# Patient Record
Sex: Male | Born: 1970 | Race: White | Hispanic: No | Marital: Married | Smoking: Former smoker
Health system: Southern US, Community
[De-identification: ages and names within clinical notes are randomized; demographics above are authoritative.]

## PROBLEM LIST (undated history)

## (undated) DIAGNOSIS — U071 COVID-19: Secondary | ICD-10-CM

## (undated) DIAGNOSIS — G4733 Obstructive sleep apnea (adult) (pediatric): Secondary | ICD-10-CM

## (undated) HISTORY — PX: OTHER SURGICAL HISTORY: SHX169

## (undated) HISTORY — DX: Obstructive sleep apnea (adult) (pediatric): G47.33

## (undated) HISTORY — PX: TONSILLECTOMY: SUR1361

---

## 1998-06-15 ENCOUNTER — Ambulatory Visit: Admission: RE | Admit: 1998-06-15 | Discharge: 1998-06-15 | Payer: Self-pay | Admitting: Internal Medicine

## 2005-01-17 ENCOUNTER — Ambulatory Visit: Payer: Self-pay | Admitting: Internal Medicine

## 2007-02-25 ENCOUNTER — Ambulatory Visit: Payer: Self-pay | Admitting: Internal Medicine

## 2007-04-16 ENCOUNTER — Ambulatory Visit (HOSPITAL_BASED_OUTPATIENT_CLINIC_OR_DEPARTMENT_OTHER): Admission: RE | Admit: 2007-04-16 | Discharge: 2007-04-16 | Payer: Self-pay | Admitting: Internal Medicine

## 2007-04-18 ENCOUNTER — Ambulatory Visit: Payer: Self-pay | Admitting: Internal Medicine

## 2008-02-28 DIAGNOSIS — G4733 Obstructive sleep apnea (adult) (pediatric): Secondary | ICD-10-CM | POA: Insufficient documentation

## 2008-02-29 ENCOUNTER — Ambulatory Visit: Payer: Self-pay | Admitting: Internal Medicine

## 2008-05-23 ENCOUNTER — Ambulatory Visit (HOSPITAL_COMMUNITY): Admission: RE | Admit: 2008-05-23 | Discharge: 2008-05-23 | Payer: Self-pay | Admitting: Family Medicine

## 2009-02-13 ENCOUNTER — Encounter: Payer: Self-pay | Admitting: Internal Medicine

## 2009-02-27 ENCOUNTER — Ambulatory Visit: Payer: Self-pay | Admitting: Internal Medicine

## 2010-03-05 ENCOUNTER — Ambulatory Visit: Payer: Self-pay | Admitting: Internal Medicine

## 2010-09-01 HISTORY — PX: PECTORALIS MAJOR REPAIR: SHX2182
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
## 2011-01-14 NOTE — Assessment & Plan Note (Signed)
[facility]                             PULMONARY OFFICE NOTE   NAME:Matthew Stanton, Matthew Stanton                   MRN:          [id number]  DATE:02/25/2007                            DOB:          1971/03/26    PROBLEM:  Obstructive sleep apnea.   HISTORY:  He continues with CPAP at 8 CWP through [place].  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  We discussed lifestyle and  sleep hygiene issues.  He had had his original sleep study on June 15, 1998 when he weighed 250 pounds.  That split protocol study showed  severe obstructive apnea with an index of 88 per hour, loud snoring and  desaturation, but normal cardiac rhythm.  CPAP was titrated down  originally to 10 CWP for an index of 5 per hour.  Subsequently he lost a  lot of weight, which he has been able to keep off and CPAP was adjusted  down to 8 CWP.  He is concerned about adequate control and he wants to  have a new titration study done.  We discussed this.   OBJECTIVE:  Weight 171 pounds, BP 108/72, pulse 88, room air saturation  97%.  He is alert, medium body build, recognizing he weighs 80 pounds less  then he did when he had his original sleep study.  HEART:  Sounds are normal.  LUNGS:  Clear.  There is no restlessness or tremor.  His nasal airway seems adequately open.   IMPRESSION:  Obstructive sleep apnea with control status uncertain now  after a significant weight loss and no longer working a rotating shift.   PLAN:  1. Replacement CPAP mask.  2. CPAP titration study for recalibration of best pressure settings.  3. We will schedule return in a year and contact him for pressure      settings as appropriate.     Clinton D. Maple Hudson, MD, Tonny Bollman, FACP  Electronically Signed    CDY/MedQ  DD: 02/25/2007  DT: 02/26/2007  Job #:  [id number]   cc:   [facility]

## 2011-01-14 NOTE — Procedures (Signed)
NAME:  Matthew Stanton, Matthew Stanton NO.:  [id number]   MEDICAL RECORD NO.:  [id number]          PATIENT TYPE:  OUT   LOCATION:  SLEEP CENTER                 FACILITY:  [facility]   PHYSICIAN:  Clinton D. Maple Hudson, MD, FCCP, FACPDATE OF BIRTH:  03/06/1993   DATE OF STUDY:  04/16/2007                            NOCTURNAL POLYSOMNOGRAM   REFERRING PHYSICIAN:  Clinton D. Young, MD, FCCP, FACP   INDICATION FOR STUDY:  Hypersomnia with sleep apnea.  A previous sleep  study was done June 15, 1998 recording an AHI of 88 per hour and CPAP  was then titrated to 10 CWP.  CPAP titration is now requested.   EPWORTH SLEEPINESS SCORE:  5/24.  BMI 27.4, weight 175 pounds.   MEDICATIONS:  Home medications limited to vitamins.   SLEEP ARCHITECTURE:  Total sleep time 353 minutes and sleep efficiency  91%.  Stage I was 4%, stage II 58%, stage III 4%, REM 34% of total sleep  time.  REM rebound is suggested.  Sleep latency 24 minutes.  REM latency  56 minutes.  Awake after sleep onset 11 minutes.  Arousal index 4.8.  No  bedtime medication was taken.   RESPIRATORY DATA:  CPAP titration protocol.  CPAP was titrated to 10  CWP, AHI 0 per hour.  A large Mirage Activa mask was used with heated  humidifier.   OXYGEN DATA:  Mild snoring before CPAP control was prevented by  therapeutic CPAP levels, maintaining oxygen saturation 97% on room air.   CARDIAC DATA:  Normal sinus rhythm.   MOVEMENT-PARASOMNIA:  No significant movement disturbance.   IMPRESSIONS-RECOMMENDATIONS:  1. Successful CPAP titration to 10 CWP, apnea/hypopnea index 0 per      hour.  The patient used his own large Mirage Activa mask with      heated humidifier.  2. Original sleep study June 15, 1998 recorded an apnea/hypopnea      index of 88 per hour with split protocol      CPAP titration to 10 CWP.  On the present titration, obstructive      events were prevented at all pressures tried and snoring was      suppressed at final  pressure of 10 CWP.      Clinton D. Maple Hudson, MD, [facility], FACP  Diplomate, Biomedical engineer of Sleep Medicine  Electronically Signed     CDY/MEDQ  D:  04/18/2007 15:39:38  T:  04/19/2007 09:59:22  Job:  [id number]

## 2011-03-04 ENCOUNTER — Ambulatory Visit: Payer: Self-pay | Admitting: Internal Medicine

## 2011-04-11 ENCOUNTER — Ambulatory Visit: Payer: Self-pay | Admitting: Internal Medicine

## 2011-05-23 ENCOUNTER — Ambulatory Visit: Payer: Self-pay | Admitting: Internal Medicine

## 2011-12-02 ENCOUNTER — Emergency Department (HOSPITAL_COMMUNITY): Payer: Managed Care, Other (non HMO)

## 2011-12-02 ENCOUNTER — Emergency Department (HOSPITAL_COMMUNITY)
Admission: EM | Admit: 2011-12-02 | Discharge: 2011-12-02 | Disposition: A | Discharge status: Home / Self Care | Payer: Managed Care, Other (non HMO) | Attending: Emergency Medicine | Admitting: Emergency Medicine

## 2011-12-02 ENCOUNTER — Encounter (HOSPITAL_COMMUNITY): Payer: Self-pay | Admitting: Emergency Medicine

## 2011-12-02 DIAGNOSIS — M79609 Pain in unspecified limb: Secondary | ICD-10-CM | POA: Insufficient documentation

## 2011-12-02 DIAGNOSIS — R52 Pain, unspecified: Secondary | ICD-10-CM | POA: Insufficient documentation

## 2011-12-02 DIAGNOSIS — M25519 Pain in unspecified shoulder: Secondary | ICD-10-CM | POA: Insufficient documentation

## 2011-12-02 DIAGNOSIS — S46912A Strain of unspecified muscle, fascia and tendon at shoulder and upper arm level, left arm, initial encounter: Secondary | ICD-10-CM

## 2011-12-02 DIAGNOSIS — IMO0002 Reserved for concepts with insufficient information to code with codable children: Secondary | ICD-10-CM | POA: Insufficient documentation

## 2011-12-02 IMAGING — CR DG SHOULDER 2+V*L*
3 series · 3 of 3 positions shown · non-contrast
Comparison: None

CLINICAL DATA: Injured left shoulder.

LEFT SHOULDER - 2+ VIEW

[view not recorded (1 of 3)]
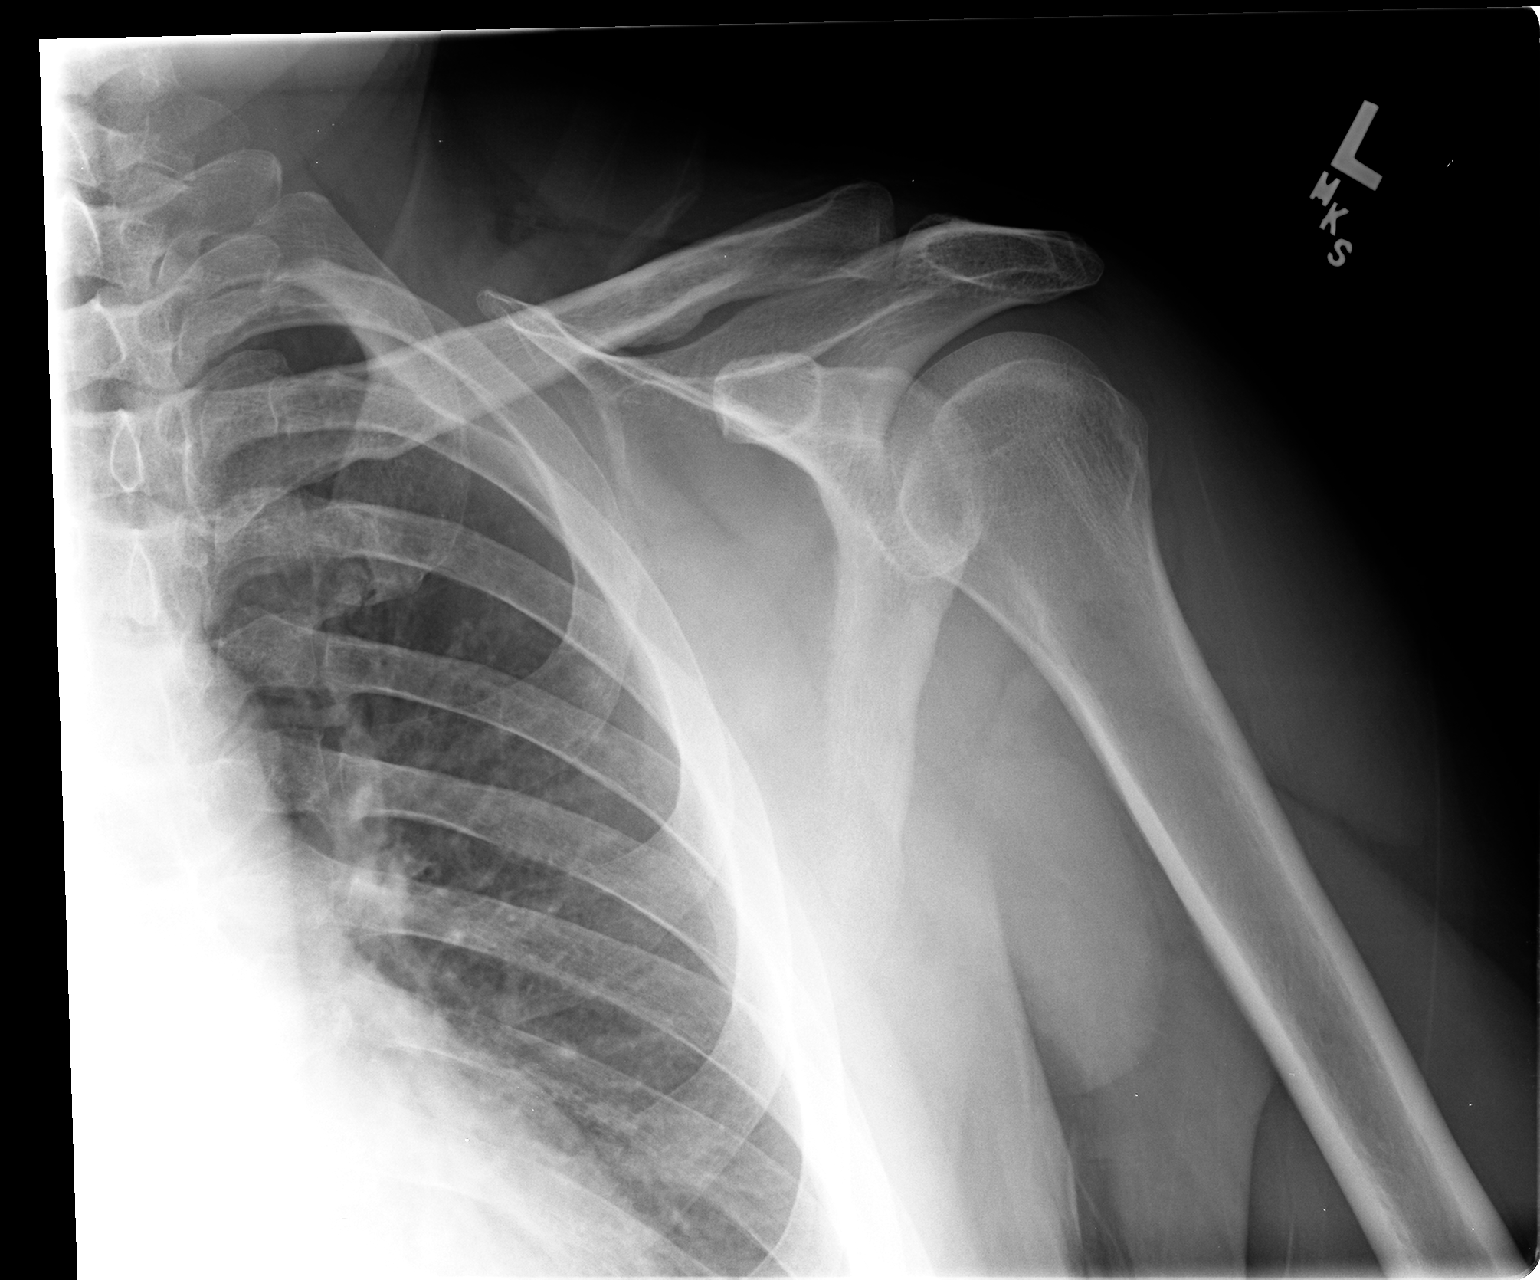

[view not recorded (2 of 3)]
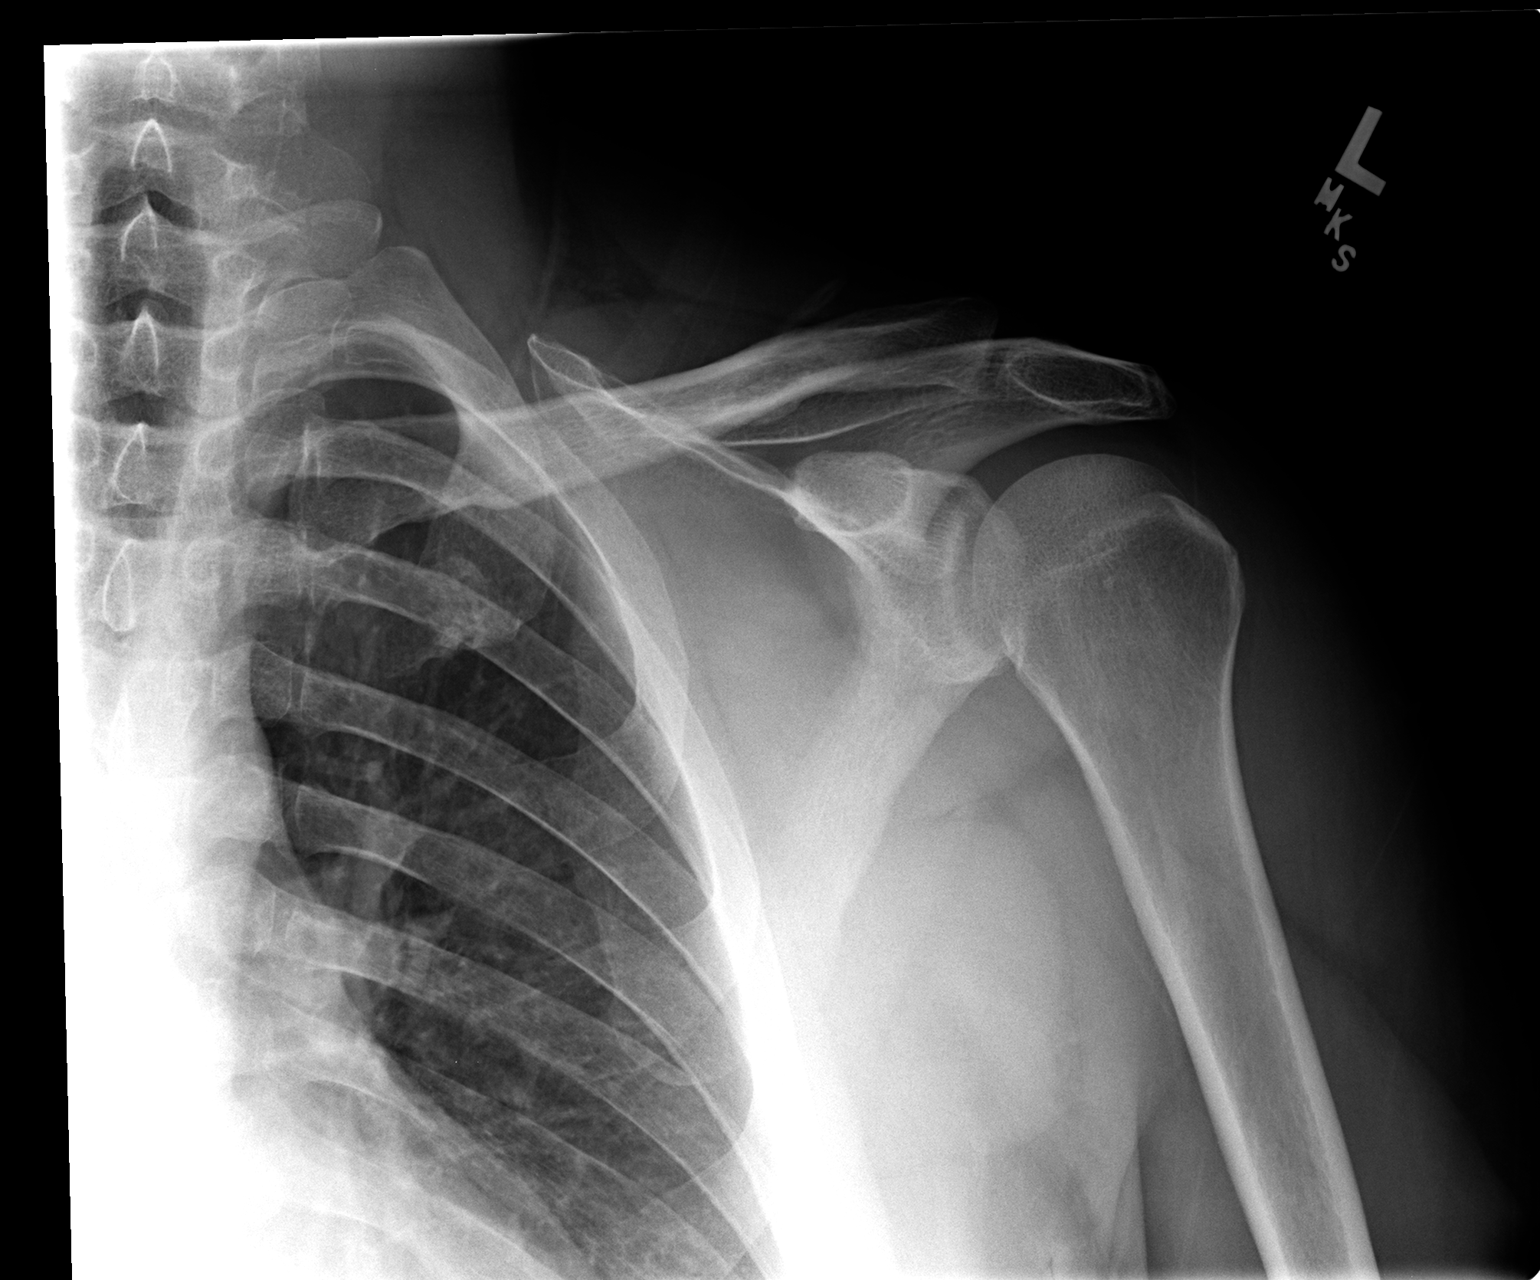

[view not recorded (3 of 3)]
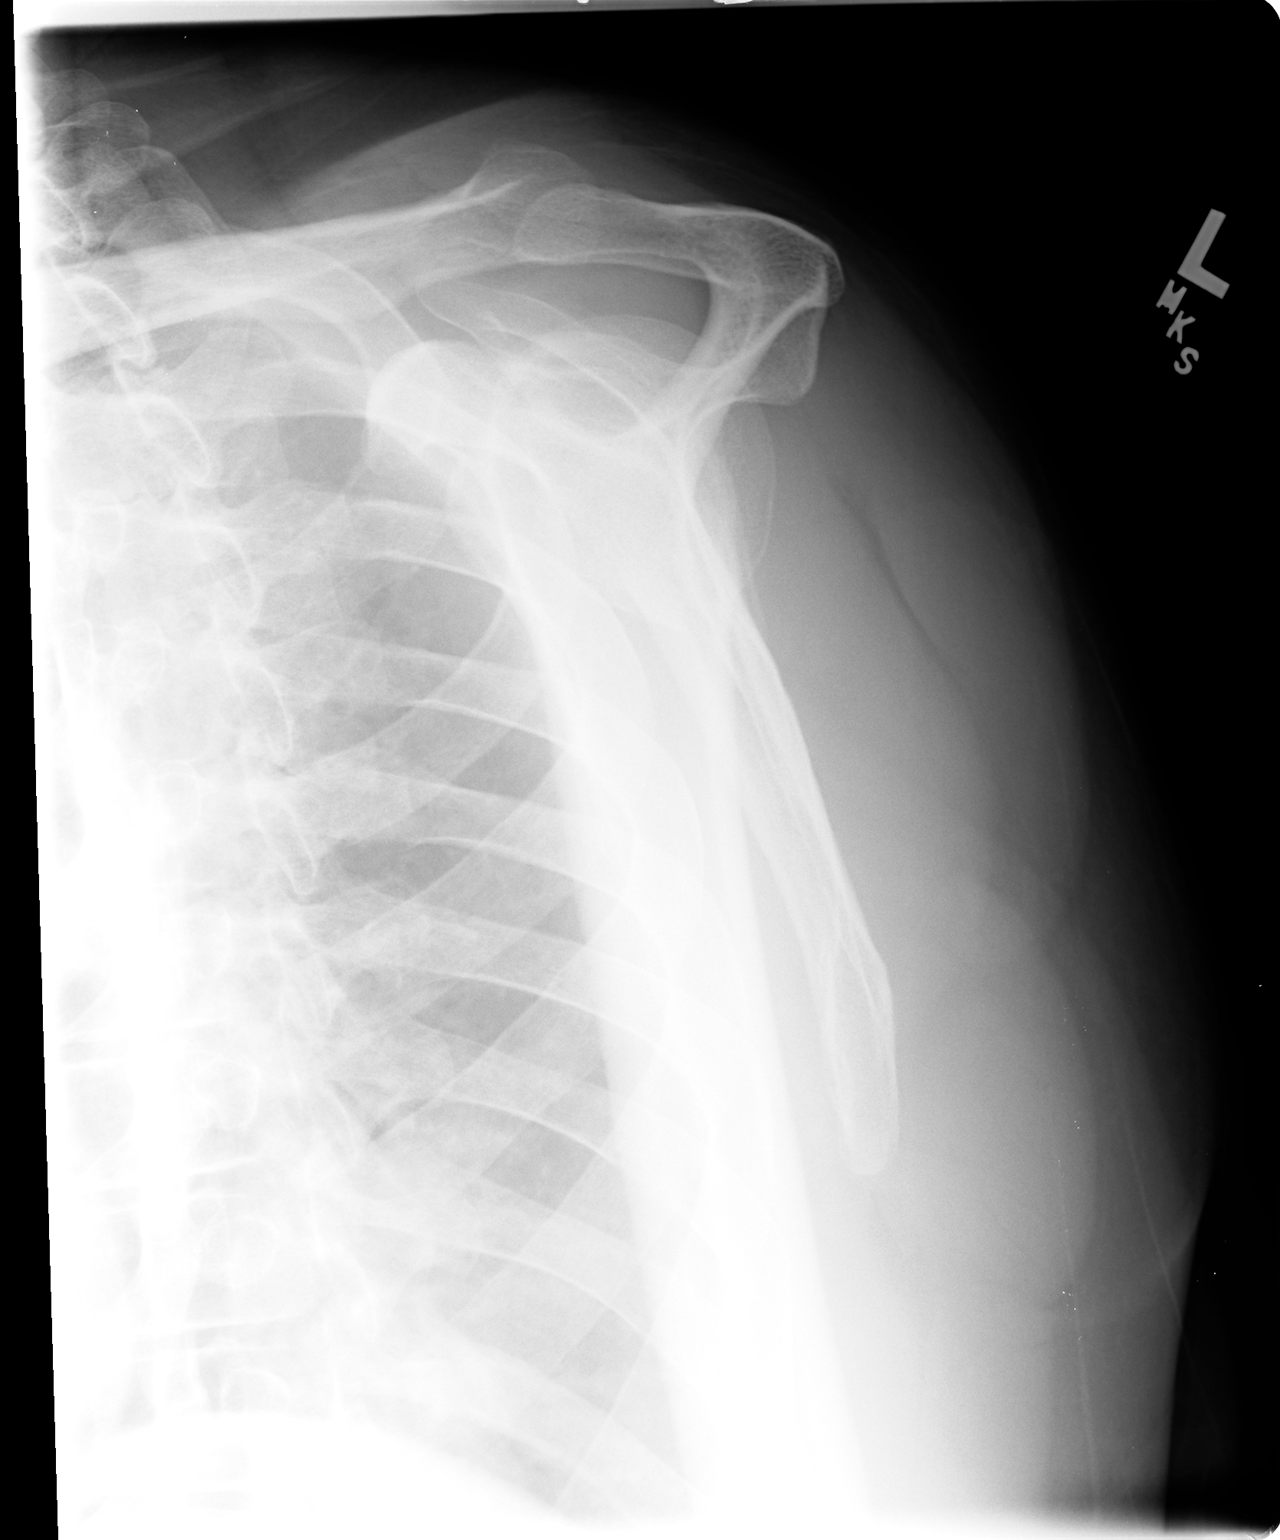

[3 of 3 positions shown; findings below may reference images not displayed]

FINDINGS: Joint spaces are maintained.  No acute bony findings.  No
abnormal soft tissue calcifications at the left lung apex is clear.
IMPRESSION: No acute bony findings.

## 2011-12-02 MED ORDER — IBUPROFEN 800 MG PO TABS
800.0000 mg | ORAL_TABLET | Freq: Once | ORAL | Status: AC
  Administered 2011-12-02: 800 mg via ORAL
  Filled 2011-12-02: qty 1

## 2011-12-02 MED ORDER — IBUPROFEN 800 MG PO TABS: 800.0000 mg | ORAL_TABLET | Freq: Three times a day (TID) | ORAL | Status: AC

## 2011-12-02 MED ORDER — HYDROCODONE-ACETAMINOPHEN 5-325 MG PO TABS: ORAL_TABLET | ORAL | Status: DC

## 2011-12-02 MED ORDER — HYDROCODONE-ACETAMINOPHEN 5-325 MG PO TABS
2.0000 | ORAL_TABLET | Freq: Once | ORAL | Status: AC
  Administered 2011-12-02: 2 via ORAL
  Filled 2011-12-02: qty 2

## 2011-12-02 NOTE — ED Notes (Signed)
Pt c/o burning/tearing pain under left axilla while lifting weights.

## 2011-12-02 NOTE — Discharge Instructions (Signed)
Your x-ray is negative for fracture or dislocation. Your examination is consistent with a muscle strain. Please see the orthopedist listed above, or the orthopedist of your choice for additional evaluation if not improving in the next 5-6 days. Please use a sling for the next 5-6 days. Ibuprofen 3 times daily with a meal for swelling and inflammationLigament Sprain Ligaments are tough, fibrous tissues that hold bones together at the joints. A sprain can occur when a ligament is stretched. This injury may take several weeks to heal. HOME CARE INSTRUCTIONS   Rest the injured area for as long as directed by your caregiver. Then slowly start using the joint as directed by your caregiver and as the pain allows.   Keep the affected joint raised if possible to lessen swelling.   Apply ice for 15 to 20 minutes to the injured area every couple hours for the first half day, then 3 to 4 times per day for the first 48 hours. Put the ice in a plastic bag and place a towel between the bag of ice and your skin.   Wear any splinting, casting, or elastic bandage applications as instructed.   Only take over-the-counter or prescription medicines for pain, discomfort, or fever as directed by your caregiver. Do not use aspirin immediately after the injury unless instructed by your caregiver. Aspirin can cause increased bleeding and bruising of the tissues.   If you were given crutches, continue to use them as instructed and do not resume weight bearing on the affected extremity until instructed.  SEEK MEDICAL CARE IF:   Your bruising, swelling, or pain increases.   You have cold and numb fingers or toes if your arm or leg was injured.  SEEK IMMEDIATE MEDICAL CARE IF:   Your toes are numb or blue if your leg was injured.   Your fingers are numb or blue if your arm was injured.   Your pain is not responding to medicines and continues to stay the same or gets worse.  MAKE SURE YOU:   Understand these  instructions.   Will watch your condition.   Will get help right away if you are not doing well or get worse.  Document Released: 08/15/2000 Document Revised: 08/07/2011 Document Reviewed: 06/13/2008 [facility] Patient Information 2012 [place], [place].Muscle Strain A muscle strain (pulled muscle) happens when a muscle is over-stretched. Recovery usually takes 5 to 6 weeks.  HOME CARE  Put ice on the injured area.  Put ice in a plastic bag.  Place a towel between your skin and the bag.  Leave the ice on for 15 to 20 minutes at a time, every hour for the first 2 days.  Do not use the muscle for several days or until your doctor says you can. Do not use the muscle if you have pain.  Wrap the injured area with an elastic bandage for comfort. Do not put it on too tightly.  Only take medicine as told by your doctor.  Warm up before exercise. This helps prevent muscle strains.  GET HELP RIGHT AWAY IF:  There is increased pain or puffiness (swelling) in the affected area. MAKE SURE YOU:  Understand these instructions.  Will watch your condition.  Will get help right away if you are not doing well or get worse.  Document Released: 05/27/2008 Document Revised: 08/07/2011 Document Reviewed: 05/27/2008 [facility] Patient Information 2012 [employer] and Norco 5 mg every 4 hours as needed for more severe pain.

## 2011-12-02 NOTE — ED Notes (Signed)
Pt states was lifting 225 lb weights when he felt a tear/ pop  In his left axilla shoulder area.
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
## 2011-12-27 NOTE — ED Provider Notes (Signed)
Evaluation and management procedures were performed by the PA/NP/resident physician under my supervision/collaboration.   Oberon Hehir D Terrion Poblano, MD 12/27/11 2045 

## 2012-01-01 ENCOUNTER — Encounter: Payer: Self-pay | Admitting: Internal Medicine

## 2012-12-21 ENCOUNTER — Encounter: Payer: Self-pay | Admitting: Internal Medicine

## 2012-12-21 ENCOUNTER — Ambulatory Visit (INDEPENDENT_AMBULATORY_CARE_PROVIDER_SITE_OTHER): Payer: Managed Care, Other (non HMO) | Admitting: Internal Medicine

## 2012-12-21 VITALS — BP 110/80 | HR 80 | Ht 66.75 in | Wt 188.2 lb

## 2012-12-21 DIAGNOSIS — G4733 Obstructive sleep apnea (adult) (pediatric): Secondary | ICD-10-CM

## 2012-12-21 NOTE — Progress Notes (Signed)
12/21/12- 42 yoM former smoker followed for OSA FOLLOWS FOR: last seen 03-05-2010; wears CPAP10 every night for about 7 hours-DME compnay is [facility] CPAP definitely prevents his snoring and he sleeps better. He is increasing his exercise to keep weight down.  ROS-see HPI Constitutional:   No-   weight loss, night sweats, fevers, chills, fatigue, lassitude. HEENT:   No-  headaches, difficulty swallowing, tooth/dental problems, sore throat,       No-  sneezing, itching, ear ache, nasal congestion, post nasal drip,  CV:  No-   chest pain, orthopnea, PND, swelling in lower extremities, anasarca,                                  dizziness, palpitations Resp: No-   shortness of breath with exertion or at rest.              No-   productive cough,  No non-productive cough,  No- coughing up of blood.              No-   change in color of mucus.  No- wheezing.   Skin: No-   rash or lesions. GI:  No-   heartburn, indigestion, abdominal pain, nausea, vomiting,  GU: . MS:  No-   joint pain or swelling.   Neuro-     nothing unusual Psych:  No- change in mood or affect. No depression or anxiety.  No memory loss.  OBJ- Physical Exam General- Alert, Oriented, Affect-appropriate, Distress- none acute Skin- rash-none, lesions- none, excoriation- none Lymphadenopathy- none Head- atraumatic            Eyes- Gross vision intact, PERRLA, conjunctivae and secretions clear            Ears- Hearing, canals-normal            Nose- Clear, no-Septal dev, mucus, polyps, erosion, perforation             Throat- Mallampati III , mucosa clear , drainage- none, tonsils- atrophic Neck- flexible , trachea midline, no stridor , thyroid nl, carotid no bruit Chest - symmetrical excursion , unlabored           Heart/CV- RRR , no murmur , no gallop  , no rub, nl s1 s2                           - JVD- none , edema- none, stasis changes- none, varices- none           Lung- clear to P&A, wheeze- none, cough- none , dullness-none,  rub- none           Chest wall-  Abd-  Br/ Gen/ Rectal- Not done, not indicated Extrem- cyanosis- none, clubbing, none, atrophy- none, strength- nl Neuro- grossly intact to observation

## 2012-12-21 NOTE — Patient Instructions (Addendum)
We can continue CPAP 10/ [facility]  Please call as needed

## 2012-12-28 NOTE — Assessment & Plan Note (Signed)
Good compliance and control with CPAP 10. No need to make changes. He has made a significant effort to maintain exercise and keep his weight down. Congratulated for this. Clearly helps.

## 2013-06-29 ENCOUNTER — Telehealth: Payer: Self-pay | Admitting: Internal Medicine

## 2013-06-29 DIAGNOSIS — G4733 Obstructive sleep apnea (adult) (pediatric): Secondary | ICD-10-CM

## 2013-06-29 NOTE — Telephone Encounter (Signed)
LMOM x 1 Need to find out what all supplies he is needing before order to be placed.

## 2013-06-30 NOTE — Telephone Encounter (Signed)
Order placed for supplies. Carron Curie, CMA

## 2013-07-15 ENCOUNTER — Telehealth: Payer: Self-pay | Admitting: Internal Medicine

## 2013-07-15 NOTE — Telephone Encounter (Signed)
Note    Order faxed to Retta Diones                                    Type Date User    Provider Comments 06/30/2013  8:40 AM Carron Curie R           Summary    Provider Comments           Note    Pt needs cpap supplies. [email or web address]       PCC, please advise.  Pt's aware that I am sending to [facility] to help find out what is going on. [facility] to update patient.

## 2013-07-19 NOTE — Telephone Encounter (Signed)
Spoke to [email or web address]  [email or web address] was called but he hung up on caller she will call him right now to see what supplies ne needs Tobe Sos

## 2013-07-19 NOTE — Telephone Encounter (Signed)
Please advise PCC. Carron Curie, CMA

## 2015-01-26 ENCOUNTER — Ambulatory Visit: Payer: Managed Care, Other (non HMO) | Admitting: Internal Medicine

## 2015-03-02 ENCOUNTER — Encounter: Payer: Self-pay | Admitting: Internal Medicine

## 2015-03-30 ENCOUNTER — Encounter: Payer: Self-pay | Admitting: Nurse Practitioner

## 2015-03-30 ENCOUNTER — Ambulatory Visit (INDEPENDENT_AMBULATORY_CARE_PROVIDER_SITE_OTHER): Payer: Managed Care, Other (non HMO) | Admitting: Nurse Practitioner

## 2015-03-30 VITALS — BP 117/74 | HR 80 | Temp 97.0°F | Ht 66.5 in | Wt 184.8 lb

## 2015-03-30 DIAGNOSIS — D649 Anemia, unspecified: Secondary | ICD-10-CM | POA: Diagnosis not present

## 2015-03-30 DIAGNOSIS — Z800 Family history of malignant neoplasm of digestive organs: Secondary | ICD-10-CM
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
## 2015-03-30 NOTE — Assessment & Plan Note (Addendum)
Patient with a family history of colorectal cancer including his father who was diagnosed at age 43 and his paternal grandmother who was diagnosed at an unknown age but passed away at age 49. Based on recommendations he is due for CRC screening starting at age 34. His anemia for which he was referred to [place] is likely due to blood donation. Given these concerns however I will send him home with a iFOBT test to evaluate for microscopic hematochezia. Return as needed unless his heme test is positive at which point we'll bring him back sooner. Otherwise, begin routine screening at age 51.

## 2015-03-30 NOTE — Assessment & Plan Note (Addendum)
Patient referred by primary care for anemia documented in July 2015 with a hemoglobin of 12.3. Per the patient, he had informed his provider at that time that he had given blood the day before. Repeat labs provided by the patient today, which were drawn a month ago, show hemoglobin of 14. His transient anemia a year ago is likely due to blood donation. However, given his family history, I will send him home with an iFOBT test to evaluate for microscopic hematochezia. It is positive we will bring him back. If it is negative we'll have him return as needed. Otherwise, begin routine screening for colon cancer at age 44.

## 2015-03-30 NOTE — Patient Instructions (Signed)
1. Complete her stool test at home and bring him back when it is done. 2. We will call you with the results. 3. If there are any abnormalities we'll have you come back for a follow-up visit. Otherwise, he should be okay to start your colon cancer screening at age 44. 15. Call us if you have any new symptoms or changes in symptoms.

## 2015-04-02 NOTE — Progress Notes (Signed)
cc'ed to pcp °

## 2015-04-09 ENCOUNTER — Ambulatory Visit (INDEPENDENT_AMBULATORY_CARE_PROVIDER_SITE_OTHER): Payer: Managed Care, Other (non HMO)

## 2015-04-09 DIAGNOSIS — D649 Anemia, unspecified: Secondary | ICD-10-CM | POA: Diagnosis not present

## 2015-04-09 LAB — IFOBT (OCCULT BLOOD): IFOBT: NEGATIVE

## 2015-04-09 NOTE — Progress Notes (Signed)
Pt returned ifobt and it was negative.  

## 2015-04-10 ENCOUNTER — Ambulatory Visit (INDEPENDENT_AMBULATORY_CARE_PROVIDER_SITE_OTHER): Payer: Managed Care, Other (non HMO) | Admitting: Internal Medicine

## 2015-04-10 ENCOUNTER — Encounter: Payer: Self-pay | Admitting: Internal Medicine

## 2015-04-10 VITALS — BP 122/6 | HR 92 | Ht 66.75 in | Wt 187.2 lb

## 2015-04-10 DIAGNOSIS — G4733 Obstructive sleep apnea (adult) (pediatric): Secondary | ICD-10-CM

## 2015-04-10 NOTE — Patient Instructions (Signed)
We can continue CPAP 10/ [facility]  Order- DME [facility]- CPAP download for pressure compliance   Dx OSA

## 2015-04-10 NOTE — Progress Notes (Signed)
12/21/12- 70 yoM former smoker followed for OSA FOLLOWS FOR: last seen 03-05-2010; wears CPAP10 every night for about 7 hours-DME compnay is [facility] CPAP definitely prevents his snoring and he sleeps better. He is increasing his exercise to keep weight down.  04/10/15- 29 yoM former smoker followed for OSA FOLLOWS FOR: Pt wears CPAP 10/ [facility] every night for about 7 hours; DME is [facility]. No DL on Airview nor has [facility] contacted patient recently for one.  Comfortable with nasal mask, no chinstrap. Not being told of snoring and he feels he sleeps well.  ROS-see HPI Constitutional:   No-   weight loss, night sweats, fevers, chills, fatigue, lassitude. HEENT:   No-  headaches, difficulty swallowing, tooth/dental problems, sore throat,       No-  sneezing, itching, ear ache, nasal congestion, post nasal drip,  CV:  No-   chest pain, orthopnea, PND, swelling in lower extremities, anasarca,                                                  dizziness, palpitations Resp: No-   shortness of breath with exertion or at rest.              No-   productive cough,  No non-productive cough,  No- coughing up of blood.              No-   change in color of mucus.  No- wheezing.   Skin: No-   rash or lesions. GI:  No-   heartburn, indigestion, abdominal pain, nausea, vomiting,  GU: . MS:  No-   joint pain or swelling.   Neuro-     nothing unusual Psych:  No- change in mood or affect. No depression or anxiety.  No memory loss.  OBJ- Physical Exam General- Alert, Oriented, Affect-appropriate, Distress- none acute Skin- rash-none, lesions- none, excoriation- none Lymphadenopathy- none Head- atraumatic            Eyes- Gross vision intact, PERRLA, conjunctivae and secretions clear            Ears- Hearing, canals-normal            Nose- Clear, no-Septal dev, mucus, polyps, erosion, perforation             Throat- Mallampati III , mucosa clear , drainage- none, tonsils- atrophic Neck- flexible , trachea midline, no  stridor , thyroid nl, carotid no bruit Chest - symmetrical excursion , unlabored           Heart/CV- RRR , no murmur , no gallop  , no rub, nl s1 s2                           - JVD- none , edema- none, stasis changes- none, varices- none           Lung- clear to P&A, wheeze- none, cough- none , dullness-none, rub- none           Chest wall-  Abd-  Br/ Gen/ Rectal- Not done, not indicated Extrem- cyanosis- none, clubbing, none, atrophy- none, strength- nl Neuro- grossly intact to observation

## 2015-04-15 NOTE — Assessment & Plan Note (Signed)
Good compliance and control as described by patient. CPAP 10/[facility]. Plan-request download for documentation

## 2015-04-25 ENCOUNTER — Telehealth: Payer: Self-pay | Admitting: Internal Medicine

## 2015-04-25 DIAGNOSIS — G4733 Obstructive sleep apnea (adult) (pediatric): Secondary | ICD-10-CM

## 2015-04-25 NOTE — Telephone Encounter (Signed)
Called and spoke to pt. Pt states he was notified by Huey Romans that he is eligible for his replacement CPAP. Pt states he has had his CPAP for 8-9 years. Pt states the CPAP is still working he feels its time to upgrade and replace.   Dr. Annamaria Boots please advise. Thanks.

## 2015-04-25 NOTE — Telephone Encounter (Signed)
Order- DME [facility] replace old CPAP machine, 10 cwp, mask of choice, humidifier, supplies, Add AirView  Dx OSA

## 2015-04-26 NOTE — Telephone Encounter (Signed)
Order placed for new CPAP machine. Pt states that he is going over to [facility] this morning to have them download his current machine and was wondering if there was anyway to have order sent soon. I advised that I will have [facility] expedite order so that they have this when he is there today. I have spoke with Sherry/PCC and she is going to work on this . Nothing further needed.

## 2015-05-10 ENCOUNTER — Encounter: Payer: Self-pay | Admitting: Internal Medicine

## 2015-10-26 ENCOUNTER — Encounter: Payer: Self-pay | Admitting: Internal Medicine

## 2016-04-04 ENCOUNTER — Encounter: Payer: Self-pay | Admitting: Internal Medicine

## 2016-04-09 ENCOUNTER — Encounter: Payer: Self-pay | Admitting: Internal Medicine

## 2016-04-09 ENCOUNTER — Ambulatory Visit (INDEPENDENT_AMBULATORY_CARE_PROVIDER_SITE_OTHER): Payer: Managed Care, Other (non HMO) | Admitting: Internal Medicine

## 2016-04-09 VITALS — BP 110/66 | HR 89 | Ht 66.75 in | Wt 182.0 lb

## 2016-04-09 DIAGNOSIS — G4733 Obstructive sleep apnea (adult) (pediatric): Secondary | ICD-10-CM

## 2016-04-09 NOTE — Assessment & Plan Note (Signed)
Excellent quality of life improvement with CPAP. Download confirms great compliance and control. We discussed comfort and treatment goals.

## 2016-04-09 NOTE — Patient Instructions (Signed)
You are really doing great with CPAP  We can continue CPAP 10/ [facility], mask of choice, humidifier, supplies, AirView     Dx OSA  Please call as needed

## 2016-04-09 NOTE — Progress Notes (Signed)
12/21/12- 89 yoM former smoker followed for OSA FOLLOWS FOR: last seen 03-05-2010; wears CPAP10 every night for about 7 hours-DME compnay is [facility] CPAP definitely prevents his snoring and he sleeps better. He is increasing his exercise to keep weight down.  04/10/15- 8 yoM former smoker followed for OSA FOLLOWS FOR: Pt wears CPAP 10/ [facility] every night for about 7 hours; DME is [facility]. No DL on Airview nor has [facility] contacted patient recently for one.  Comfortable with nasal mask, no chinstrap. Not being told of snoring and he feels he sleeps well.  04/09/2016-45 year old male former smoker followed for OSA FOLLOWS FOR: DME: [facility]; Pt wears CPAP nightly and no issues with pressure. No new supplies needed at this time. DL attached. He feels he is sleeping extremely well. Denies snore through or problems with mask. Current machine is 60-year-old and working very nicely. Download confirms excellent compliance and control 83%/AHI 1.3  ROS-see HPI Constitutional:   No-   weight loss, night sweats, fevers, chills, fatigue, lassitude. HEENT:   No-  headaches, difficulty swallowing, tooth/dental problems, sore throat,       No-  sneezing, itching, ear ache, nasal congestion, post nasal drip,  CV:  No-   chest pain, orthopnea, PND, swelling in lower extremities, anasarca,                                                  dizziness, palpitations Resp: No-   shortness of breath with exertion or at rest.              No-   productive cough,  No non-productive cough,  No- coughing up of blood.              No-   change in color of mucus.  No- wheezing.   Skin: No-   rash or lesions. GI:  No-   heartburn, indigestion, abdominal pain, nausea, vomiting,  GU: . MS:  No-   joint pain or swelling.   Neuro-     nothing unusual Psych:  No- change in mood or affect. No depression or anxiety.  No memory loss.  OBJ- Physical Exam General- Alert, Oriented, Affect-appropriate, Distress- none acute, +  overweight Skin-  rash-none, lesions- none, excoriation- none Lymphadenopathy- none Head- atraumatic            Eyes- Gross vision intact, PERRLA, conjunctivae and secretions clear            Ears- Hearing, canals-normal            Nose- Clear, no-Septal dev, mucus, polyps, erosion, perforation             Throat- Mallampati III , mucosa clear , drainage- none, tonsils- atrophic Neck- flexible , trachea midline, no stridor , thyroid nl, carotid no bruit Chest - symmetrical excursion , unlabored           Heart/CV- RRR , no murmur , no gallop  , no rub, nl s1 s2                           - JVD- none , edema- none, stasis changes- none, varices- none           Lung- clear to P&A, wheeze- none, cough- none , dullness-none, rub- none  Chest wall-  Abd-  Br/ Gen/ Rectal- Not done, not indicated Extrem- cyanosis- none, clubbing, none, atrophy- none, strength- nl Neuro- grossly intact to observation

## 2016-04-16 ENCOUNTER — Encounter: Payer: Self-pay | Admitting: Internal Medicine

## 2016-04-16 ENCOUNTER — Ambulatory Visit: Payer: Managed Care, Other (non HMO) | Admitting: Nurse Practitioner
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
## 2016-06-03 ENCOUNTER — Encounter (HOSPITAL_COMMUNITY)
Admission: RE | Disposition: A | Discharge status: Home / Self Care | Payer: Self-pay | Source: Ambulatory Visit | Attending: General Surgery

## 2016-06-03 ENCOUNTER — Ambulatory Visit (HOSPITAL_COMMUNITY)
Admission: RE | Admit: 2016-06-03 | Discharge: 2016-06-03 | Disposition: A | Discharge status: Home / Self Care | Payer: 59 | Source: Ambulatory Visit | Attending: General Surgery | Admitting: General Surgery

## 2016-06-03 ENCOUNTER — Encounter (HOSPITAL_COMMUNITY): Payer: Self-pay

## 2016-06-03 DIAGNOSIS — Z800 Family history of malignant neoplasm of digestive organs: Secondary | ICD-10-CM | POA: Insufficient documentation

## 2016-06-03 DIAGNOSIS — Z1211 Encounter for screening for malignant neoplasm of colon: Secondary | ICD-10-CM | POA: Insufficient documentation

## 2016-06-03 HISTORY — PX: COLONOSCOPY: SHX5424

## 2016-06-03 SURGERY — COLONOSCOPY
Anesthesia: Moderate Sedation

## 2016-06-03 MED ORDER — SODIUM CHLORIDE 0.9 % IV SOLN
INTRAVENOUS | Status: DC
  Administered 2016-06-03: 07:00:00 via INTRAVENOUS

## 2016-06-03 MED ORDER — MEPERIDINE HCL 50 MG/ML IJ SOLN
INTRAMUSCULAR | Status: DC | PRN
  Administered 2016-06-03: 50 mg via INTRAVENOUS

## 2016-06-03 MED ORDER — MIDAZOLAM HCL 5 MG/5ML IJ SOLN
INTRAMUSCULAR | Status: DC | PRN
  Administered 2016-06-03: 3 mg via INTRAVENOUS
  Administered 2016-06-03 (×2): 1 mg via INTRAVENOUS

## 2016-06-03 MED ORDER — MEPERIDINE HCL 100 MG/ML IJ SOLN
INTRAMUSCULAR | Status: AC
  Filled 2016-06-03: qty 1

## 2016-06-03 MED ORDER — MIDAZOLAM HCL 5 MG/5ML IJ SOLN
INTRAMUSCULAR | Status: AC
  Filled 2016-06-03: qty 5

## 2016-06-03 MED ORDER — STERILE WATER FOR IRRIGATION IR SOLN
Status: DC | PRN
  Administered 2016-06-03: 08:00:00

## 2016-06-03 NOTE — Op Note (Signed)
[facility] Patient Name: Matthew Stanton Procedure Date: 06/03/2016 7:19 AM MRN: [id number] Date of Birth: 1970/12/12 Attending MD: Aviva Signs , MD CSN: [id number] Age: 45 Admit Type: Outpatient Procedure:                Colonoscopy Indications:              Screening in patient at increased risk: Family                            history of 1st-degree relative with colorectal                            cancer, Colon cancer screening in patient at                            increased risk: Colorectal cancer in father Providers:                Aviva Signs, MD, Janeece Riggers, RN, Isabella Stalling,                            Technician Referring MD:              Medicines:                Midazolam 5 mg IV, Meperidine 50 mg IV Complications:            No immediate complications. Estimated blood loss:                            None. Estimated Blood Loss:     Estimated blood loss: none. Procedure:                Pre-Anesthesia Assessment:                           - Prior to the procedure, a History and Physical                            was performed, and patient medications and                            allergies were reviewed. The patient is competent.                            The risks and benefits of the procedure and the                            sedation options and risks were discussed with the                            patient. All questions were answered and informed                            consent was obtained. Patient identification and  proposed procedure were verified by the nurse in                            the pre-procedure area. Mental Status Examination:                            alert and oriented. Airway Examination: normal                            oropharyngeal airway and neck mobility. Respiratory                            Examination: clear to auscultation. CV Examination:                            RRR, no murmurs, no  S3 or S4. Prophylactic                            Antibiotics: The patient does not require                            prophylactic antibiotics. Prior Anticoagulants: The                            patient has taken no previous anticoagulant or                            antiplatelet agents. ASA Grade Assessment: I - A                            normal, healthy patient. After reviewing the risks                            and benefits, the patient was deemed in                            satisfactory condition to undergo the procedure.                            The anesthesia plan was to use moderate sedation /                            analgesia (conscious sedation). Immediately prior                            to administration of medications, the patient was                            re-assessed for adequacy to receive sedatives. The                            heart rate, respiratory rate, oxygen saturations,  blood pressure, adequacy of pulmonary ventilation,                            and response to care were monitored throughout the                            procedure. The physical status of the patient was                            re-assessed after the procedure.                           After obtaining informed consent, the colonoscope                            was passed under direct vision. Throughout the                            procedure, the patient's blood pressure, pulse, and                            oxygen saturations were monitored continuously. The                            [id number] scope was introduced through                            the anus and advanced to the the cecum, identified                            by the appendiceal orifice, ileocecal valve and                            palpation. No anatomical landmarks were                            photographed. The entire colon was well visualized.                             The colonoscopy was performed with ease. The                            patient tolerated the procedure well. The quality                            of the bowel preparation was adequate. The total                            duration of the procedure was 9 minutes. Scope In: 7:33:08 AM Scope Out: 7:42:39 AM Scope Withdrawal Time: 0 hours 4 minutes 22 seconds  Total Procedure Duration: 0 hours 9 minutes 31 seconds  Findings:      The perianal and digital rectal examinations were normal.      The entire examined colon appeared normal  on direct and retroflexion       views. Impression:               - The entire examined colon is normal on direct and                            retroflexion views.                           - No specimens collected. Moderate Sedation:      Moderate (conscious) sedation was administered by the endoscopy nurse       and supervised by the endoscopist. The following parameters were       monitored: oxygen saturation, heart rate, blood pressure, and response       to care. Recommendation:           - Written discharge instructions were provided to                            the patient.                           - The signs and symptoms of potential delayed                            complications were discussed with the patient.                           - Patient has a contact number available for                            emergencies.                           - Return to normal activities tomorrow.                           - Resume previous diet.                           - Continue present medications.                           - Repeat colonoscopy in 5 years for screening                            purposes. Procedure Code(s):        --- Professional ---                           [id number], Colorectal cancer screening; colonoscopy on                            individual at high risk Diagnosis Code(s):        --- Professional ---                            Z80.0, Family history of malignant neoplasm of  digestive organs CPT copyright 2016 American Medical Association. All rights reserved. The codes documented in this report are preliminary and upon coder review may  be revised to meet current compliance requirements. Aviva Signs, MD Aviva Signs, MD 06/03/2016 7:48:43 AM This report has been signed electronically. Number of Addenda: 0

## 2016-06-03 NOTE — Interval H&P Note (Signed)
History and Physical Interval Note:  06/03/2016 7:28 AM  Matthew Stanton  has presented today for surgery, with the diagnosis of family history of colon cancer  The various methods of treatment have been discussed with the patient and family. After consideration of risks, benefits and other options for treatment, the patient has consented to  Procedure(s): COLONOSCOPY (N/A) as a surgical intervention .  The patient's history has been reviewed, patient examined, no change in status, stable for surgery.  I have reviewed the patient's chart and labs.  Questions were answered to the patient's satisfaction.     Aviva Signs A

## 2016-06-03 NOTE — Discharge Instructions (Signed)
Colonoscopy, Care After °Refer to this sheet in the next few weeks. These instructions provide you with information on caring for yourself after your procedure. Your health care provider may also give you more specific instructions. Your treatment has been planned according to current medical practices, but problems sometimes occur. Call your health care provider if you have any problems or questions after your procedure. °WHAT TO EXPECT AFTER THE PROCEDURE  °After your procedure, it is typical to have the following: °· A small amount of blood in your stool. °· Moderate amounts of gas and mild abdominal cramping or bloating. °HOME CARE INSTRUCTIONS °· Do not drive, operate machinery, or sign important documents for 24 hours. °· You may shower and resume your regular physical activities, but move at a slower pace for the first 24 hours. °· Take frequent rest periods for the first 24 hours. °· Walk around or put a warm pack on your abdomen to help reduce abdominal cramping and bloating. °· Drink enough fluids to keep your urine clear or pale yellow. °· You may resume your normal diet as instructed by your health care provider. Avoid heavy or fried foods that are hard to digest. °· Avoid drinking alcohol for 24 hours or as instructed by your health care provider. °· Only take over-the-counter or prescription medicines as directed by your health care provider. °· If a tissue sample (biopsy) was taken during your procedure: °¨ Do not take aspirin or blood thinners for 7 days, or as instructed by your health care provider. °¨ Do not drink alcohol for 7 days, or as instructed by your health care provider. °¨ Eat soft foods for the first 24 hours. °SEEK MEDICAL CARE IF: °You have persistent spotting of blood in your stool 2-3 days after the procedure. °SEEK IMMEDIATE MEDICAL CARE IF: °· You have more than a small spotting of blood in your stool. °· You pass large blood clots in your stool. °· Your abdomen is swollen  (distended). °· You have nausea or vomiting. °· You have a fever. °· You have increasing abdominal pain that is not relieved with medicine. °  °This information is not intended to replace advice given to you by your health care provider. Make sure you discuss any questions you have with your health care provider. °  °Document Released: 04/01/2004 Document Revised: 06/08/2013 Document Reviewed: 04/25/2013 °Elsevier Interactive Patient Education ©2016 [employer] ° °

## 2016-06-10 ENCOUNTER — Encounter (HOSPITAL_COMMUNITY): Payer: Self-pay | Admitting: General Surgery

## 2016-09-15 DIAGNOSIS — S134XXA Sprain of ligaments of cervical spine, initial encounter: Secondary | ICD-10-CM | POA: Diagnosis not present

## 2016-09-15 DIAGNOSIS — M546 Pain in thoracic spine: Secondary | ICD-10-CM | POA: Diagnosis not present

## 2016-09-15 DIAGNOSIS — S338XXA Sprain of other parts of lumbar spine and pelvis, initial encounter: Secondary | ICD-10-CM | POA: Diagnosis not present

## 2016-10-17 DIAGNOSIS — S134XXA Sprain of ligaments of cervical spine, initial encounter: Secondary | ICD-10-CM | POA: Diagnosis not present

## 2016-10-17 DIAGNOSIS — S338XXA Sprain of other parts of lumbar spine and pelvis, initial encounter: Secondary | ICD-10-CM | POA: Diagnosis not present

## 2016-10-17 DIAGNOSIS — M546 Pain in thoracic spine: Secondary | ICD-10-CM | POA: Diagnosis not present

## 2016-10-21 DIAGNOSIS — S134XXA Sprain of ligaments of cervical spine, initial encounter: Secondary | ICD-10-CM | POA: Diagnosis not present

## 2016-10-21 DIAGNOSIS — M546 Pain in thoracic spine: Secondary | ICD-10-CM | POA: Diagnosis not present

## 2016-10-21 DIAGNOSIS — S338XXA Sprain of other parts of lumbar spine and pelvis, initial encounter: Secondary | ICD-10-CM | POA: Diagnosis not present

## 2016-12-04 DIAGNOSIS — M546 Pain in thoracic spine: Secondary | ICD-10-CM | POA: Diagnosis not present

## 2016-12-04 DIAGNOSIS — S134XXA Sprain of ligaments of cervical spine, initial encounter: Secondary | ICD-10-CM | POA: Diagnosis not present

## 2016-12-04 DIAGNOSIS — S338XXA Sprain of other parts of lumbar spine and pelvis, initial encounter: Secondary | ICD-10-CM | POA: Diagnosis not present

## 2017-03-13 DIAGNOSIS — Z0000 Encounter for general adult medical examination without abnormal findings: Secondary | ICD-10-CM | POA: Diagnosis not present

## 2017-03-13 DIAGNOSIS — Z1389 Encounter for screening for other disorder: Secondary | ICD-10-CM | POA: Diagnosis not present

## 2017-03-13 DIAGNOSIS — G4730 Sleep apnea, unspecified: Secondary | ICD-10-CM | POA: Diagnosis not present

## 2017-03-13 DIAGNOSIS — G4731 Primary central sleep apnea: Secondary | ICD-10-CM | POA: Diagnosis not present

## 2017-06-01 DIAGNOSIS — G4733 Obstructive sleep apnea (adult) (pediatric): Secondary | ICD-10-CM | POA: Diagnosis not present

## 2017-07-27 ENCOUNTER — Telehealth: Payer: Self-pay | Admitting: Internal Medicine

## 2017-07-27 NOTE — Telephone Encounter (Signed)
Katie, please advise if it is ok to use a RNA spot (if available) for patient. Thanks!

## 2017-07-27 NOTE — Telephone Encounter (Signed)
ATC pt, no answer. Left message for pt to call back.  

## 2017-07-27 NOTE — Telephone Encounter (Signed)
Yes okay to use RNA slot. Thanks.

## 2017-07-28 NOTE — Telephone Encounter (Signed)
Will close encounter, as nothing further is needed.  

## 2017-07-28 NOTE — Telephone Encounter (Signed)
ATC pt, no answer. Left message for pt to call back.  

## 2017-07-28 NOTE — Telephone Encounter (Signed)
Patient returned call, was scheduled to see Dr. Annamaria Boots on 08/05/17.  No call back is needed.

## 2017-08-05 ENCOUNTER — Encounter: Payer: Self-pay | Admitting: Internal Medicine

## 2017-08-05 ENCOUNTER — Ambulatory Visit: Payer: 59 | Admitting: Internal Medicine

## 2017-08-05 VITALS — BP 126/78 | HR 86 | Ht 67.0 in | Wt 192.0 lb

## 2017-08-05 DIAGNOSIS — G4733 Obstructive sleep apnea (adult) (pediatric): Secondary | ICD-10-CM

## 2017-08-05 DIAGNOSIS — Z800 Family history of malignant neoplasm of digestive organs: Secondary | ICD-10-CM

## 2017-08-05 NOTE — Patient Instructions (Addendum)
We can continue CPAP 10, mask of choice, humidifier supplies, AirView   Dx OSA  Please call if we can help

## 2017-08-05 NOTE — Assessment & Plan Note (Signed)
Good compliance and control.  He benefits from CPAP and is satisfied to continue.  We can continue pressure 10 with supplies as needed.

## 2017-08-05 NOTE — Assessment & Plan Note (Signed)
Father died of CHF complicating chemotherapy for colon cancer.  Patient gets colonoscopies and is not had respiratory problems with these.

## 2017-08-05 NOTE — Progress Notes (Signed)
HPI male former smoker followed for OSA NPSG 06/15/98    AHI 88/ hr, body weight 250 lbs  --------------------------------------------------------------------------  04/09/2016-46 year old male former smoker followed for OSA FOLLOWS FOR: DME: [facility]; Pt wears CPAP nightly and no issues with pressure. No new supplies needed at this time. DL attached. He feels he is sleeping extremely well. Denies snore through or problems with mask. Current machine is 37-year-old and working very nicely. Download confirms excellent compliance and control 83%/AHI 1.3  08/05/17- 46 year old male former smoker followed for OSA CPAP 10/ [facility] --Pt states that he has been doing good. Denies any complaints or concerns. States his CPAP is working good for him. DME: [facility] Doing very well.  Despite exercising regularly, he has gained some weight and we discussed this. Download compliance 93%, AHI 1.3/hour.  He sleeps better and is used to sleeping with CPAP, using an old machine for travel so it does not show in the download.  ROS-see HPI + = positive Constitutional:   No-   weight loss, night sweats, fevers, chills, fatigue, lassitude. HEENT:   No-  headaches, difficulty swallowing, tooth/dental problems, sore throat,       No-  sneezing, itching, ear ache, nasal congestion, post nasal drip,  CV:  No-   chest pain, orthopnea, PND, swelling in lower extremities, anasarca,                                                  dizziness, palpitations Resp: No-   shortness of breath with exertion or at rest.              No-   productive cough,  No non-productive cough,  No- coughing up of blood.              No-   change in color of mucus.  No- wheezing.   Skin: No-   rash or lesions. GI:  No-   heartburn, indigestion, abdominal pain, nausea, vomiting,  GU: . MS:  No-   joint pain or swelling.   Neuro-     nothing unusual Psych:  No- change in mood or affect. No depression or anxiety.  No memory loss.  OBJ- Physical  Exam General- Alert, Oriented, Affect-appropriate, Distress- none acute,  Skin- rash-none, lesions- none, excoriation- none Lymphadenopathy- none Head- atraumatic            Eyes- Gross vision intact, PERRLA, conjunctivae and secretions clear            Ears- Hearing, canals-normal            Nose- Clear, no-Septal dev, mucus, polyps, erosion, perforation             Throat- Mallampati III , mucosa clear , drainage- none, tonsils- atrophic Neck- flexible , trachea midline, no stridor , thyroid nl, carotid no bruit Chest - symmetrical excursion , unlabored           Heart/CV- RRR , no murmur , no gallop  , no rub, nl s1 s2                           - JVD- none , edema- none, stasis changes- none, varices- none           Lung- clear to P&A, wheeze- none, cough- none , dullness-none, rub- none  Chest wall-  Abd-  Br/ Gen/ Rectal- Not done, not indicated Extrem- cyanosis- none, clubbing, none, atrophy- none, strength- nl Neuro- grossly intact to observation

## 2017-08-12 ENCOUNTER — Telehealth: Payer: Self-pay | Admitting: Internal Medicine

## 2017-08-12 NOTE — Telephone Encounter (Signed)
Called [facility] to see what was needed. They wanted to verify that an order for the patient to have a CPAP was in the patient's chart. Advised him that the original order was sent back in 2016. We only sent an order on 12/6 for the patient to continue the CPAP and for him to get new supplies for the next year.   The rep verbalized understanding. Nothing else needed at time of call.

## 2017-11-12 DIAGNOSIS — M722 Plantar fascial fibromatosis: Secondary | ICD-10-CM | POA: Diagnosis not present

## 2018-03-18 DIAGNOSIS — D649 Anemia, unspecified: Secondary | ICD-10-CM | POA: Diagnosis not present

## 2018-03-18 DIAGNOSIS — E663 Overweight: Secondary | ICD-10-CM | POA: Diagnosis not present

## 2018-03-18 DIAGNOSIS — Z0000 Encounter for general adult medical examination without abnormal findings: Secondary | ICD-10-CM | POA: Diagnosis not present

## 2018-03-18 DIAGNOSIS — Z1389 Encounter for screening for other disorder: Secondary | ICD-10-CM | POA: Diagnosis not present

## 2018-03-18 DIAGNOSIS — G4730 Sleep apnea, unspecified: Secondary | ICD-10-CM | POA: Diagnosis not present

## 2018-03-30 ENCOUNTER — Encounter: Payer: Self-pay | Admitting: Internal Medicine

## 2018-04-01 ENCOUNTER — Encounter: Payer: Self-pay | Admitting: Internal Medicine

## 2018-04-01 ENCOUNTER — Ambulatory Visit: Payer: 59 | Admitting: Internal Medicine

## 2018-04-01 DIAGNOSIS — M722 Plantar fascial fibromatosis: Secondary | ICD-10-CM

## 2018-04-01 DIAGNOSIS — G4733 Obstructive sleep apnea (adult) (pediatric): Secondary | ICD-10-CM | POA: Diagnosis not present

## 2018-04-01 NOTE — Patient Instructions (Signed)
We can continue CPAP 10, mask of choice, humidifier, supplies, AirView  Please call if we can help

## 2018-04-01 NOTE — Progress Notes (Signed)
HPI male former smoker followed for OSA NPSG 06/15/98    AHI 88/ hr, body weight 250 lbs  -------------------------------------------------------------------------- 08/05/17- 47 year old male former smoker followed for OSA CPAP 10/ [facility] --Pt states that he has been doing good. Denies any complaints or concerns. States his CPAP is working good for him. DME: [facility] Doing very well.  Despite exercising regularly, he has gained some weight and we discussed this. Download compliance 93%, AHI 1.3/hour.  He sleeps better and is used to sleeping with CPAP, using an old machine for travel so it does not show in the download.  04/01/2018- 47 year old male former smoker followed for OSA CPAP 10/ [facility] -----OSA: DME [facility]. Pt wears CPAP nighlty and DL attached. No new supplies needed at this.  He again reports doing extremely well with CPAP-very comfortable with pressure 10.  Machine is about 46 years old but he uses spare for travel. Download 100% compliance AHI 1.3/hour. No acute medical problems but being treated for plantar fasciitis.  ROS-see HPI + = positive Constitutional:   No-   weight loss, night sweats, fevers, chills, fatigue, lassitude. HEENT:   No-  headaches, difficulty swallowing, tooth/dental problems, sore throat,       No-  sneezing, itching, ear ache, nasal congestion, post nasal drip,  CV:  No-   chest pain, orthopnea, PND, swelling in lower extremities, anasarca,                                                  dizziness, palpitations Resp: No-   shortness of breath with exertion or at rest.              No-   productive cough,  No non-productive cough,  No- coughing up of blood.              No-   change in color of mucus.  No- wheezing.   Skin: No-   rash or lesions. GI:  No-   heartburn, indigestion, abdominal pain, nausea, vomiting,  GU: . MS:  No-   joint pain or swelling.   Neuro-     nothing unusual Psych:  No- change in mood or affect. No depression or anxiety.  No  memory loss.  OBJ- Physical Exam General- Alert, Oriented, Affect-appropriate, Distress- none acute, + not obese Skin- rash-none, lesions- none, excoriation- none Lymphadenopathy- none Head- atraumatic            Eyes- Gross vision intact, PERRLA, conjunctivae and secretions clear            Ears- Hearing, canals-normal            Nose- Clear, no-Septal dev, mucus, polyps, erosion, perforation             Throat- Mallampati III , mucosa clear , drainage- none, tonsils- atrophic Neck- flexible , trachea midline, no stridor , thyroid nl, carotid no bruit Chest - symmetrical excursion , unlabored           Heart/CV- RRR , no murmur , no gallop  , no rub, nl s1 s2                           - JVD- none , edema- none, stasis changes- none, varices- none           Lung- clear  to P&A, wheeze- none, cough- none , dullness-none, rub- none           Chest wall-  Abd-  Br/ Gen/ Rectal- Not done, not indicated Extrem- cyanosis- none, clubbing, none, atrophy- none, strength- nl Neuro- grossly intact to observation

## 2018-04-02 DIAGNOSIS — M722 Plantar fascial fibromatosis: Secondary | ICD-10-CM | POA: Insufficient documentation

## 2018-04-02 NOTE — Assessment & Plan Note (Signed)
Managed by orthopedist.  This does not interfere with his sleep directly.

## 2018-04-02 NOTE — Assessment & Plan Note (Signed)
He lost significant weight after his original diagnostic sleep study but has been stable and feeling very well with CPAP 10.  He sleeps better and definitely benefits. Plan-continue CPAP 10

## 2018-04-07 DIAGNOSIS — M545 Low back pain: Secondary | ICD-10-CM | POA: Diagnosis not present

## 2018-04-07 DIAGNOSIS — S39012A Strain of muscle, fascia and tendon of lower back, initial encounter: Secondary | ICD-10-CM | POA: Diagnosis not present

## 2018-04-07 DIAGNOSIS — M539 Dorsopathy, unspecified: Secondary | ICD-10-CM | POA: Diagnosis not present

## 2018-04-07 DIAGNOSIS — M542 Cervicalgia: Secondary | ICD-10-CM | POA: Diagnosis not present

## 2018-04-07 DIAGNOSIS — S161XXA Strain of muscle, fascia and tendon at neck level, initial encounter: Secondary | ICD-10-CM | POA: Diagnosis not present

## 2018-04-26 ENCOUNTER — Ambulatory Visit: Payer: 59 | Admitting: Internal Medicine

## 2018-04-28 DIAGNOSIS — M542 Cervicalgia: Secondary | ICD-10-CM | POA: Diagnosis not present

## 2018-04-28 DIAGNOSIS — M545 Low back pain: Secondary | ICD-10-CM | POA: Diagnosis not present

## 2018-07-12 DIAGNOSIS — M545 Low back pain: Secondary | ICD-10-CM | POA: Diagnosis not present

## 2018-07-16 DIAGNOSIS — M542 Cervicalgia: Secondary | ICD-10-CM | POA: Diagnosis not present

## 2018-07-16 DIAGNOSIS — M545 Low back pain: Secondary | ICD-10-CM | POA: Diagnosis not present

## 2018-07-16 DIAGNOSIS — M2560 Stiffness of unspecified joint, not elsewhere classified: Secondary | ICD-10-CM | POA: Diagnosis not present

## 2018-07-20 DIAGNOSIS — M545 Low back pain: Secondary | ICD-10-CM | POA: Diagnosis not present

## 2018-07-20 DIAGNOSIS — M542 Cervicalgia: Secondary | ICD-10-CM | POA: Diagnosis not present

## 2018-07-20 DIAGNOSIS — M2560 Stiffness of unspecified joint, not elsewhere classified: Secondary | ICD-10-CM | POA: Diagnosis not present

## 2018-07-23 DIAGNOSIS — M2560 Stiffness of unspecified joint, not elsewhere classified: Secondary | ICD-10-CM | POA: Diagnosis not present

## 2018-07-23 DIAGNOSIS — M545 Low back pain: Secondary | ICD-10-CM | POA: Diagnosis not present

## 2018-07-23 DIAGNOSIS — M542 Cervicalgia: Secondary | ICD-10-CM | POA: Diagnosis not present

## 2018-07-26 DIAGNOSIS — M545 Low back pain: Secondary | ICD-10-CM | POA: Diagnosis not present

## 2018-07-26 DIAGNOSIS — M542 Cervicalgia: Secondary | ICD-10-CM | POA: Diagnosis not present

## 2018-07-26 DIAGNOSIS — M2560 Stiffness of unspecified joint, not elsewhere classified: Secondary | ICD-10-CM | POA: Diagnosis not present

## 2018-08-03 DIAGNOSIS — M542 Cervicalgia: Secondary | ICD-10-CM | POA: Diagnosis not present

## 2018-08-03 DIAGNOSIS — M2560 Stiffness of unspecified joint, not elsewhere classified: Secondary | ICD-10-CM | POA: Diagnosis not present

## 2018-08-03 DIAGNOSIS — M545 Low back pain: Secondary | ICD-10-CM | POA: Diagnosis not present

## 2018-08-05 DIAGNOSIS — M542 Cervicalgia: Secondary | ICD-10-CM | POA: Diagnosis not present

## 2018-08-05 DIAGNOSIS — M545 Low back pain: Secondary | ICD-10-CM | POA: Diagnosis not present

## 2018-08-05 DIAGNOSIS — M2560 Stiffness of unspecified joint, not elsewhere classified: Secondary | ICD-10-CM | POA: Diagnosis not present

## 2018-08-09 DIAGNOSIS — M545 Low back pain: Secondary | ICD-10-CM | POA: Diagnosis not present

## 2019-04-04 ENCOUNTER — Other Ambulatory Visit: Payer: Self-pay

## 2019-04-04 ENCOUNTER — Ambulatory Visit: Payer: 59 | Admitting: Internal Medicine

## 2019-04-04 ENCOUNTER — Encounter: Payer: Self-pay | Admitting: Internal Medicine

## 2019-04-04 DIAGNOSIS — Z800 Family history of malignant neoplasm of digestive organs: Secondary | ICD-10-CM

## 2019-04-04 DIAGNOSIS — G4733 Obstructive sleep apnea (adult) (pediatric): Secondary | ICD-10-CM | POA: Diagnosis not present

## 2019-04-04 NOTE — Patient Instructions (Signed)
We can continue CPAP 10, mask of choice, humidifier, supplies, AirView/ card   Please call if we can help

## 2019-04-04 NOTE — Progress Notes (Signed)
HPI male former smoker followed for OSA NPSG 06/15/98    AHI 88/ hr, body weight 250 lbs  -------------------------------------------------------------------------- 04/01/2018- 48 year old male former smoker followed for OSA CPAP 10/ [facility] -----OSA: DME [facility]. Pt wears CPAP nighlty and DL attached. No new supplies needed at this.  He again reports doing extremely well with CPAP-very comfortable with pressure 10.  Machine is about 47 years old but he uses spare for travel. Download 100% compliance AHI 1.3/hour. No acute medical problems but being treated for plantar fasciitis.  04/04/2019- 48 year old male former smoker followed for OSA CPAP 10/ [facility] Download compliance 100%, AHI 1.1/ hr Body weight today 184 lbs -----OSA on CPAP 10, DME: [facility], no complaints He watches his weight and has no major medical issues identified, pending routine f/u with his PCP tomorrow. Comfortable with his CPAP pressure and mask. Machine is about 47 years old.  He is maintaining Covid precautions. Denies other pressing health issues.   ROS-see HPI + = positive Constitutional:   No-   weight loss, night sweats, fevers, chills, fatigue, lassitude. HEENT:   No-  headaches, difficulty swallowing, tooth/dental problems, sore throat,       No-  sneezing, itching, ear ache, nasal congestion, post nasal drip,  CV:  No-   chest pain, orthopnea, PND, swelling in lower extremities, anasarca,                      dizziness, palpitations Resp: No-   shortness of breath with exertion or at rest.              No-   productive cough,  No non-productive cough,  No- coughing up of blood.              No-   change in color of mucus.  No- wheezing.   Skin: No-   rash or lesions. GI:  No-   heartburn, indigestion, abdominal pain, nausea, vomiting,  GU: . MS:  No-   joint pain or swelling.   Neuro-     nothing unusual Psych:  No- change in mood or affect. No depression or anxiety.  No memory loss.  OBJ- Physical Exam General-  Alert, Oriented, Affect-appropriate, Distress- none acute, + not obese Skin- rash-none, lesions- none, excoriation- none Lymphadenopathy- none Head- atraumatic            Eyes- Gross vision intact, PERRLA, conjunctivae and secretions clear            Ears- Hearing, canals-normal            Nose- Clear, no-Septal dev, mucus, polyps, erosion, perforation             Throat- Mallampati III , mucosa clear , drainage- none, tonsils- atrophic Neck- flexible , trachea midline, no stridor , thyroid nl, carotid no bruit Chest - symmetrical excursion , unlabored           Heart/CV- RRR , no murmur , no gallop  , no rub, nl s1 s2                           - JVD- none , edema- none, stasis changes- none, varices- none           Lung- clear to P&A, wheeze- none, cough- none , dullness-none, rub- none           Chest wall-  Abd-  Br/ Gen/ Rectal- Not done, not indicated Extrem- cyanosis- none,  clubbing, none, atrophy- none, strength- nl Neuro- grossly intact to observation

## 2019-05-24 NOTE — Assessment & Plan Note (Signed)
Continuing to benefit from CPAP with good compliance and control. Plan- continue CPAP 10

## 2019-05-24 NOTE — Assessment & Plan Note (Signed)
He says long term surveillance sustained appropriately.

## 2019-07-20 ENCOUNTER — Emergency Department (HOSPITAL_COMMUNITY): Payer: 59

## 2019-07-20 ENCOUNTER — Inpatient Hospital Stay (HOSPITAL_COMMUNITY)
Admission: EM | Admit: 2019-07-20 | Discharge: 2019-07-22 | DRG: 023 | Disposition: A | Discharge status: Home / Self Care | Payer: 59 | Attending: Internal Medicine | Admitting: Internal Medicine

## 2019-07-20 ENCOUNTER — Emergency Department (HOSPITAL_COMMUNITY): Payer: 59 | Admitting: Certified Registered"

## 2019-07-20 ENCOUNTER — Inpatient Hospital Stay (HOSPITAL_COMMUNITY): Payer: 59

## 2019-07-20 ENCOUNTER — Encounter (HOSPITAL_COMMUNITY): Payer: Self-pay | Admitting: Radiology

## 2019-07-20 ENCOUNTER — Encounter (HOSPITAL_COMMUNITY)
Admission: EM | Disposition: A | Discharge status: Home / Self Care | Payer: Self-pay | Source: Home / Self Care | Attending: Pulmonary Disease

## 2019-07-20 DIAGNOSIS — I6389 Other cerebral infarction: Secondary | ICD-10-CM

## 2019-07-20 DIAGNOSIS — R4701 Aphasia: Secondary | ICD-10-CM | POA: Diagnosis present

## 2019-07-20 DIAGNOSIS — I6602 Occlusion and stenosis of left middle cerebral artery: Secondary | ICD-10-CM | POA: Diagnosis not present

## 2019-07-20 DIAGNOSIS — U071 COVID-19: Secondary | ICD-10-CM | POA: Diagnosis not present

## 2019-07-20 DIAGNOSIS — I63312 Cerebral infarction due to thrombosis of left middle cerebral artery: Secondary | ICD-10-CM

## 2019-07-20 DIAGNOSIS — I6522 Occlusion and stenosis of left carotid artery: Secondary | ICD-10-CM | POA: Diagnosis present

## 2019-07-20 DIAGNOSIS — I63412 Cerebral infarction due to embolism of left middle cerebral artery: Secondary | ICD-10-CM

## 2019-07-20 DIAGNOSIS — Q211 Atrial septal defect: Secondary | ICD-10-CM | POA: Diagnosis not present

## 2019-07-20 DIAGNOSIS — I429 Cardiomyopathy, unspecified: Secondary | ICD-10-CM | POA: Diagnosis present

## 2019-07-20 DIAGNOSIS — G8191 Hemiplegia, unspecified affecting right dominant side: Secondary | ICD-10-CM | POA: Diagnosis present

## 2019-07-20 DIAGNOSIS — R29710 NIHSS score 10: Secondary | ICD-10-CM | POA: Diagnosis present

## 2019-07-20 DIAGNOSIS — G510 Bell's palsy: Secondary | ICD-10-CM | POA: Diagnosis present

## 2019-07-20 DIAGNOSIS — W19XXXA Unspecified fall, initial encounter: Secondary | ICD-10-CM | POA: Diagnosis present

## 2019-07-20 DIAGNOSIS — I63513 Cerebral infarction due to unspecified occlusion or stenosis of bilateral middle cerebral arteries: Secondary | ICD-10-CM | POA: Diagnosis present

## 2019-07-20 DIAGNOSIS — Z720 Tobacco use: Secondary | ICD-10-CM

## 2019-07-20 DIAGNOSIS — J95821 Acute postprocedural respiratory failure: Secondary | ICD-10-CM | POA: Diagnosis not present

## 2019-07-20 DIAGNOSIS — G4733 Obstructive sleep apnea (adult) (pediatric): Secondary | ICD-10-CM | POA: Diagnosis present

## 2019-07-20 DIAGNOSIS — Z800 Family history of malignant neoplasm of digestive organs: Secondary | ICD-10-CM | POA: Diagnosis not present

## 2019-07-20 DIAGNOSIS — I671 Cerebral aneurysm, nonruptured: Secondary | ICD-10-CM | POA: Diagnosis present

## 2019-07-20 DIAGNOSIS — D62 Acute posthemorrhagic anemia: Secondary | ICD-10-CM | POA: Diagnosis not present

## 2019-07-20 DIAGNOSIS — Z9289 Personal history of other medical treatment: Secondary | ICD-10-CM

## 2019-07-20 DIAGNOSIS — I639 Cerebral infarction, unspecified: Secondary | ICD-10-CM | POA: Diagnosis present

## 2019-07-20 DIAGNOSIS — Z79899 Other long term (current) drug therapy: Secondary | ICD-10-CM

## 2019-07-20 HISTORY — DX: COVID-19: U07.1

## 2019-07-20 HISTORY — PX: IR PERCUTANEOUS ART THROMBECTOMY/INFUSION INTRACRANIAL INC DIAG ANGIO: IMG6087

## 2019-07-20 HISTORY — PX: RADIOLOGY WITH ANESTHESIA: SHX6223

## 2019-07-20 HISTORY — PX: IR CT HEAD LTD: IMG2386

## 2019-07-20 LAB — DIFFERENTIAL
Abs Immature Granulocytes: 0.1 10*3/uL — ABNORMAL HIGH (ref 0.00–0.07)
Basophils Absolute: 0 10*3/uL (ref 0.0–0.1)
Basophils Relative: 0 %
Eosinophils Absolute: 0 10*3/uL (ref 0.0–0.5)
Eosinophils Relative: 0 %
Immature Granulocytes: 1 %
Lymphocytes Relative: 28 %
Lymphs Abs: 2.9 10*3/uL (ref 0.7–4.0)
Monocytes Absolute: 0.9 10*3/uL (ref 0.1–1.0)
Monocytes Relative: 9 %
Neutro Abs: 6.4 10*3/uL (ref 1.7–7.7)
Neutrophils Relative %: 62 %

## 2019-07-20 LAB — COMPREHENSIVE METABOLIC PANEL
ALT: 19 U/L (ref 0–44)
AST: 14 U/L — ABNORMAL LOW (ref 15–41)
Albumin: 3.6 g/dL (ref 3.5–5.0)
Alkaline Phosphatase: 47 U/L (ref 38–126)
Anion gap: 7 (ref 5–15)
BUN: 16 mg/dL (ref 6–20)
CO2: 23 mmol/L (ref 22–32)
Calcium: 8.6 mg/dL — ABNORMAL LOW (ref 8.9–10.3)
Chloride: 107 mmol/L (ref 98–111)
Creatinine, Ser: 0.86 mg/dL (ref 0.61–1.24)
GFR calc Af Amer: >60 mL/min (ref >60)
GFR calc non Af Amer: >60 mL/min (ref >60)
Glucose, Bld: 103 mg/dL — ABNORMAL HIGH (ref 70–99)
Potassium: 3.8 mmol/L (ref 3.5–5.1)
Sodium: 137 mmol/L (ref 135–145)
Total Bilirubin: 0.9 mg/dL (ref 0.3–1.2)
Total Protein: 6.3 g/dL — ABNORMAL LOW (ref 6.5–8.1)

## 2019-07-20 LAB — SARS CORONAVIRUS 2 BY RT PCR (HOSPITAL ORDER, PERFORMED IN ~~LOC~~ HOSPITAL LAB): SARS Coronavirus 2: POSITIVE — AB

## 2019-07-20 LAB — CBC
HCT: 37.9 % — ABNORMAL LOW (ref 39.0–52.0)
Hemoglobin: 12.8 g/dL — ABNORMAL LOW (ref 13.0–17.0)
MCH: 30.8 pg (ref 26.0–34.0)
MCHC: 33.8 g/dL (ref 30.0–36.0)
MCV: 91.3 fL (ref 80.0–100.0)
Platelets: 208 10*3/uL (ref 150–400)
RBC: 4.15 MIL/uL — ABNORMAL LOW (ref 4.22–5.81)
RDW: 13 % (ref 11.5–15.5)
WBC: 10.3 10*3/uL (ref 4.0–10.5)
nRBC: 0 % (ref 0.0–0.2)

## 2019-07-20 LAB — POCT I-STAT 7, (LYTES, BLD GAS, ICA,H+H)
Acid-Base Excess: 1 mmol/L (ref 0.0–2.0)
Bicarbonate: 25.6 mmol/L (ref 20.0–28.0)
Calcium, Ion: 1.21 mmol/L (ref 1.15–1.40)
HCT: 34 % — ABNORMAL LOW (ref 39.0–52.0)
Hemoglobin: 11.6 g/dL — ABNORMAL LOW (ref 13.0–17.0)
O2 Saturation: 100 %
Patient temperature: 98.8
Potassium: 3.8 mmol/L (ref 3.5–5.1)
Sodium: 137 mmol/L (ref 135–145)
TCO2: 27 mmol/L (ref 22–32)
pCO2 arterial: 42.3 mmHg (ref 32.0–48.0)
pH, Arterial: 7.39 (ref 7.350–7.450)
pO2, Arterial: 481 mmHg — ABNORMAL HIGH (ref 83.0–108.0)

## 2019-07-20 LAB — RAPID URINE DRUG SCREEN, HOSP PERFORMED
Amphetamines: NOT DETECTED
Barbiturates: NOT DETECTED
Benzodiazepines: POSITIVE — AB
Cocaine: NOT DETECTED
Opiates: NOT DETECTED
Tetrahydrocannabinol: NOT DETECTED

## 2019-07-20 LAB — URINALYSIS, ROUTINE W REFLEX MICROSCOPIC
Bacteria, UA: NONE SEEN
Bilirubin Urine: NEGATIVE
Glucose, UA: NEGATIVE mg/dL
Hgb urine dipstick: NEGATIVE
Ketones, ur: NEGATIVE mg/dL
Leukocytes,Ua: NEGATIVE
Nitrite: NEGATIVE
Protein, ur: 30 mg/dL — AB
Specific Gravity, Urine: >1.046 — ABNORMAL HIGH (ref 1.005–1.030)
pH: 5 (ref 5.0–8.0)

## 2019-07-20 LAB — CBG MONITORING, ED: Glucose-Capillary: 98 mg/dL (ref 70–99)

## 2019-07-20 LAB — GLUCOSE, CAPILLARY
Glucose-Capillary: 101 mg/dL — ABNORMAL HIGH (ref 70–99)
Glucose-Capillary: 97 mg/dL (ref 70–99)
Glucose-Capillary: 93 mg/dL (ref 70–99)

## 2019-07-20 LAB — VITAMIN D 25 HYDROXY (VIT D DEFICIENCY, FRACTURES): Vit D, 25-Hydroxy: 26.95 ng/mL — ABNORMAL LOW (ref 30–100)

## 2019-07-20 LAB — APTT: aPTT: 22 seconds — ABNORMAL LOW (ref 24–36)

## 2019-07-20 LAB — ECHOCARDIOGRAM COMPLETE
Height: 70 in
Weight: 2998.26 oz

## 2019-07-20 LAB — MRSA PCR SCREENING: MRSA by PCR: NEGATIVE

## 2019-07-20 LAB — PROTIME-INR
INR: 1.1 (ref 0.8–1.2)
Prothrombin Time: 14.1 seconds (ref 11.4–15.2)

## 2019-07-20 LAB — ETHANOL: Alcohol, Ethyl (B): <10 mg/dL (ref <10)

## 2019-07-20 IMAGING — CT CT HEAD CODE STROKE
3 series · 15 of 47 positions shown, 18 images · non-contrast
Comparison: None.

CLINICAL DATA: Code stroke.  Right-sided weakness

EXAM:
CT HEAD WITHOUT CONTRAST
TECHNIQUE: Contiguous axial images were obtained from the base of the skull
through the vertex without intravenous contrast.

[Series 2: head w o · axial · 0.49mm/px · z∈[+107,+242]mm · 9 of 33 slices shown, 12 images]
[im 3/33  brain]
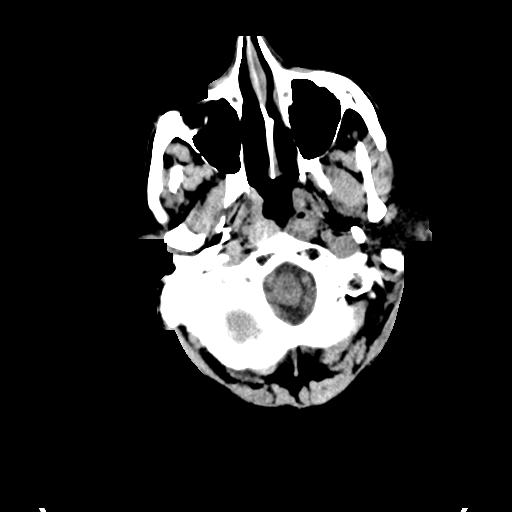
[im 3/33  bone]
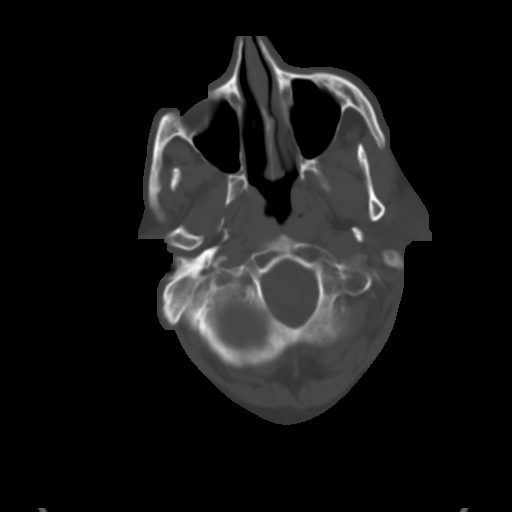
[im 6/33  brain]
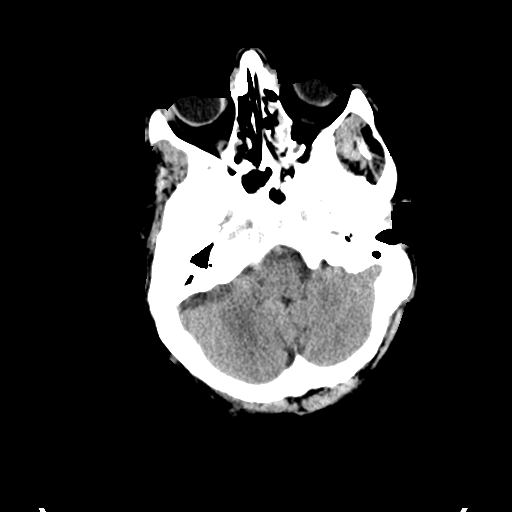
[im 9/33  brain]
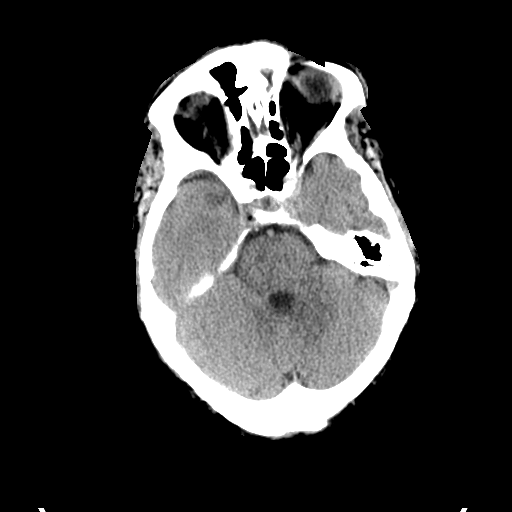
[im 13/33  brain]
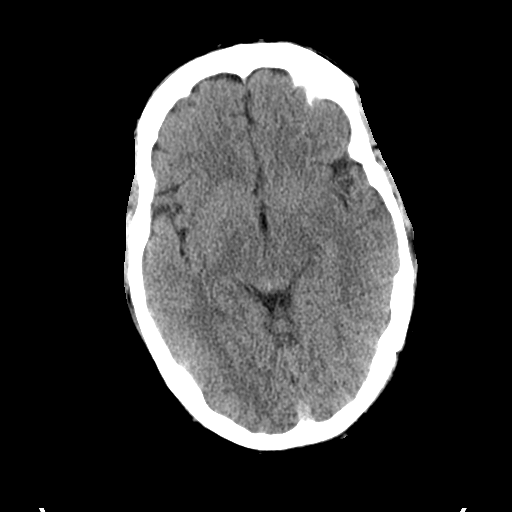
[im 17/33  brain]
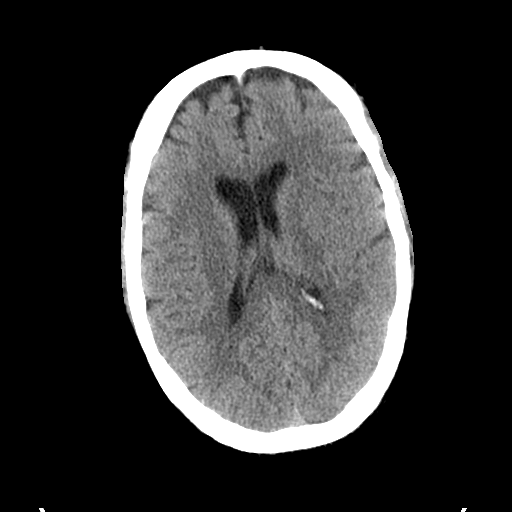
[im 17/33  bone]
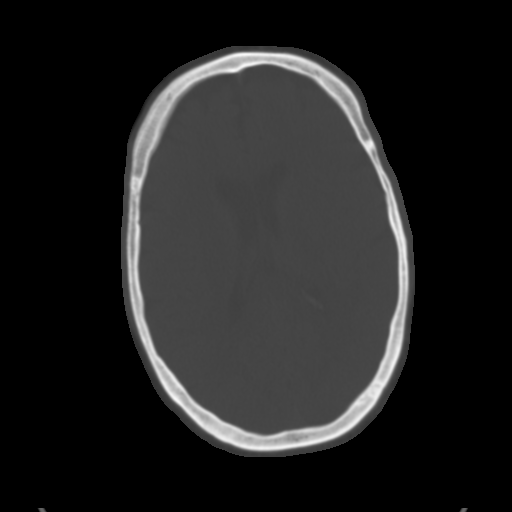
[im 20/33  brain]
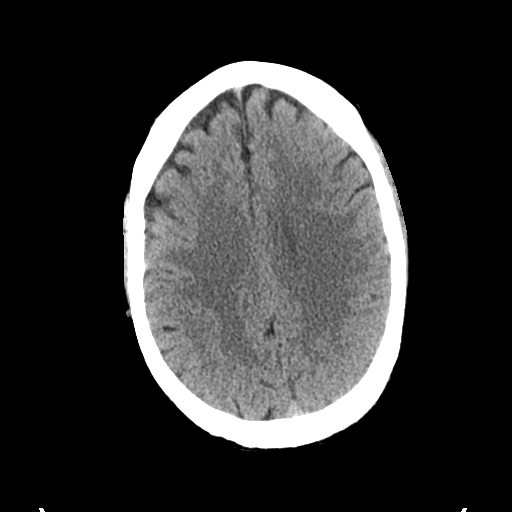
[im 24/33  brain]
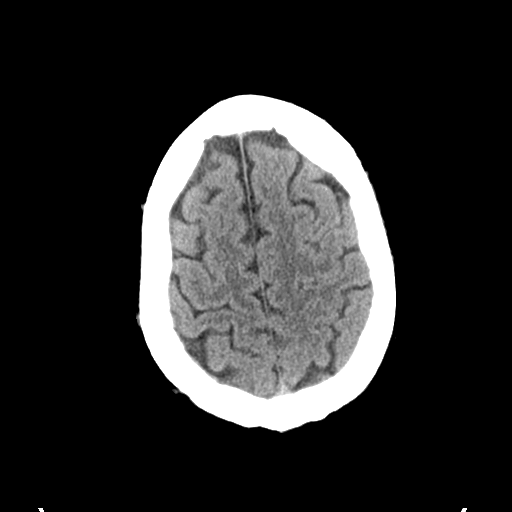
[im 27/33  brain]
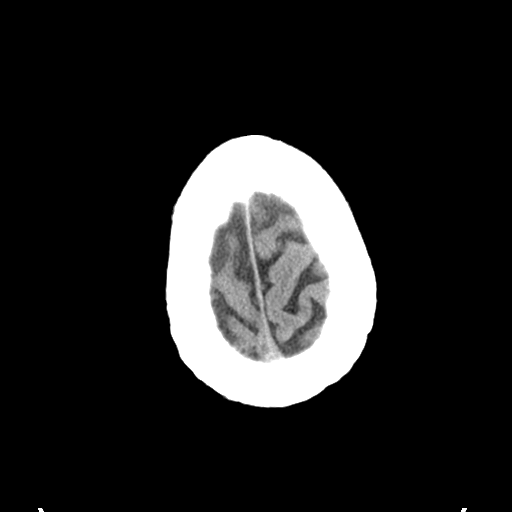
[im 30/33  brain]
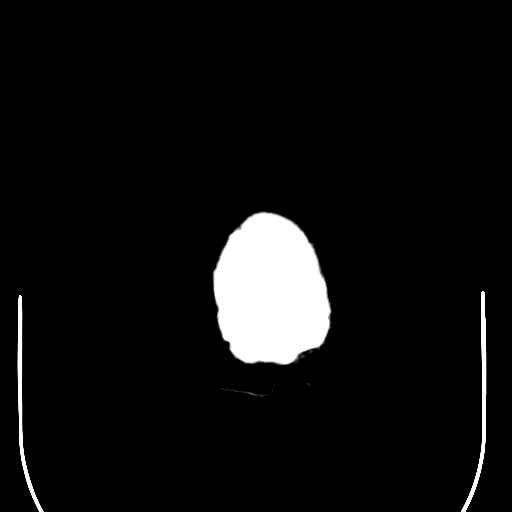
[im 30/33  bone]
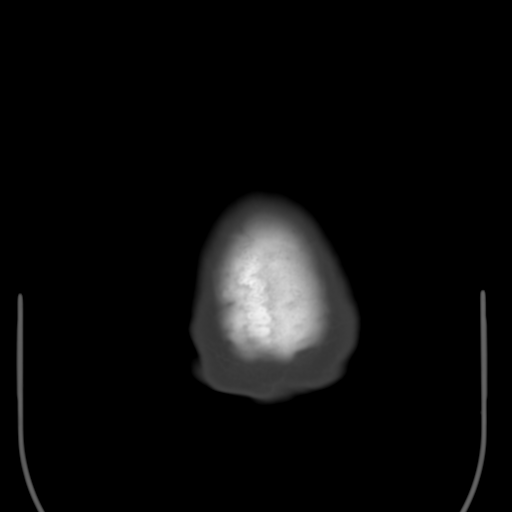

[Series 4: coronal soft · coronal · 0.32mm/px · 3 of 72 slices shown]
[im 24/72  brain]
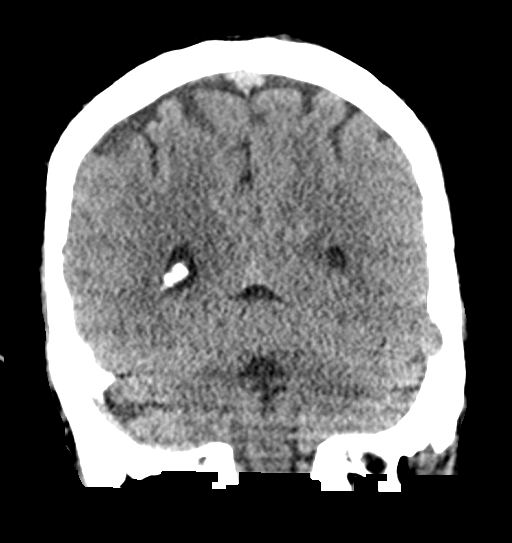
[im 32/72  brain]
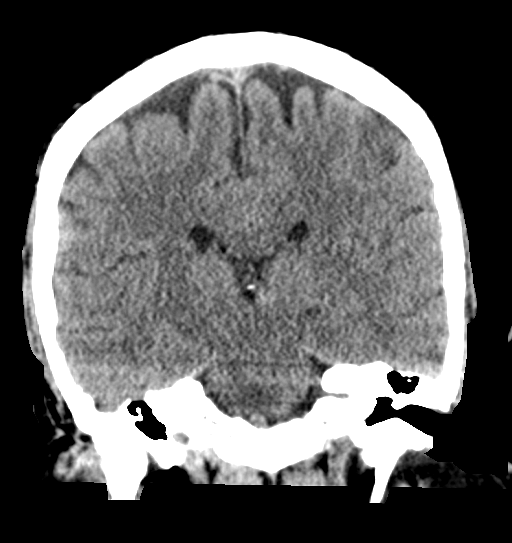
[im 40/72  brain]
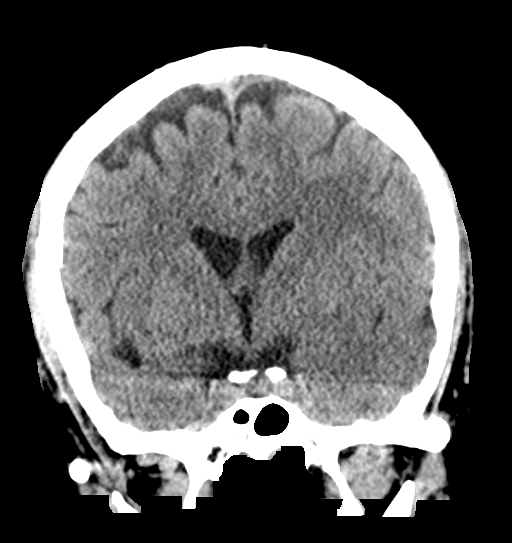

[Series 5: sagittal soft · sagittal · 0.34mm/px · 3 of 55 slices shown]
[im 19/55  brain]
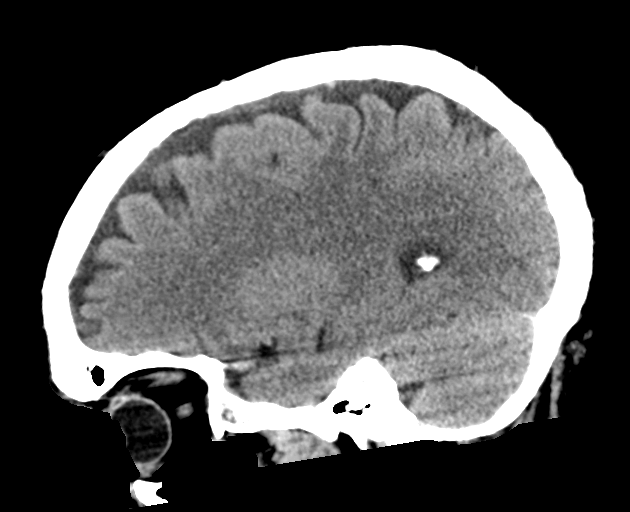
[im 28/55  brain]
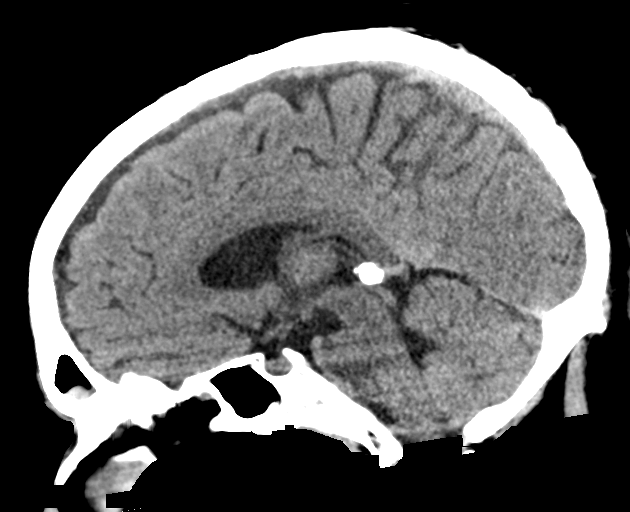
[im 37/55  brain]
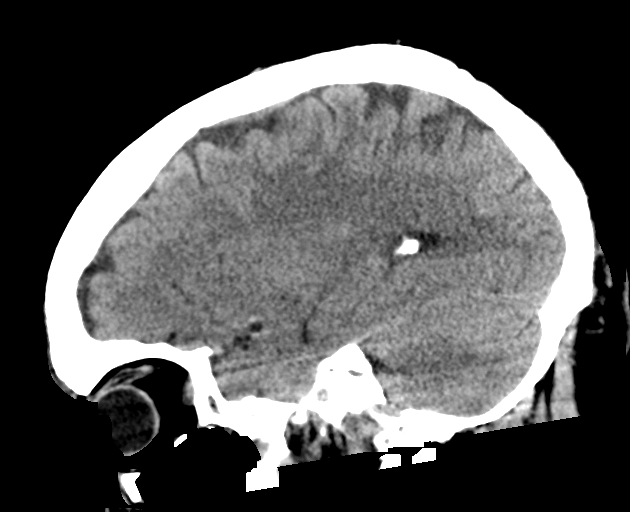

[15 of 47 positions shown; findings below may reference images not displayed]

FINDINGS: Brain: There is no acute intracranial hemorrhage, mass effect, or
edema. Gray-white differentiation remains preserved. Ventricles and
sulci are normal in size and configuration. There is no extra-axial
fluid collection.

Vascular: Increased density of the left MCA.

Skull: Calvarium is unremarkable.

Sinuses/Orbits: Moderate paranasal sinus opacification primarily
involving the ethmoids. Orbits are unremarkable.

Other: None.

ASPECTS (Alberta Stroke Program Early CT Score)

- Ganglionic level infarction (caudate, lentiform nuclei, internal
capsule, insula, M1-M3 cortex): 7

- Supraganglionic infarction (M4-M6 cortex): 3

Total score (0-10 with 10 being normal): 10
IMPRESSION: 1. No acute intracranial hemorrhage or evidence of acute infarction.
Increased density of the left MCA suggesting intraluminal thrombus.
2. ASPECTS is 10

These results were called by telephone at the time of interpretation
on [DATE] at [DATE] to provider DRAGON , who verbally
acknowledged these results.

## 2019-07-20 IMAGING — XA IR PERCUTANEOUS ART THORMBECTOMY/INFUSION INTRACRANIAL INCLUDE D
2 series · 12 of 24 positions shown · non-contrast
Comparison: CT angiogram of the head and neck [DATE].

INDICATION: New onset of left gaze deviation with right-sided hemiplegia.
Occluded left internal carotid artery terminus extending into the
left middle cerebral artery and the left anterior cerebral artery
proximally.

EXAM:
1. EMERGENT LARGE VESSEL OCCLUSION THROMBOLYSIS (anterior
CIRCULATION)
TECHNIQUE: Following a full explanation of the procedure along with the
potential associated complications, an informed witnessed consent
was obtained patient's spouse. The risks of intracranial hemorrhage
of 10%, worsening neurological deficit, ventilator dependency, death
and inability to revascularize were all reviewed in detail with the
patient's spouse.

[Series 12: <mpr range> · axial · 4.0mm · 0.40mm/px · z∈[-148,+2]mm · 3 of 40 slices shown]
[im 8/40]
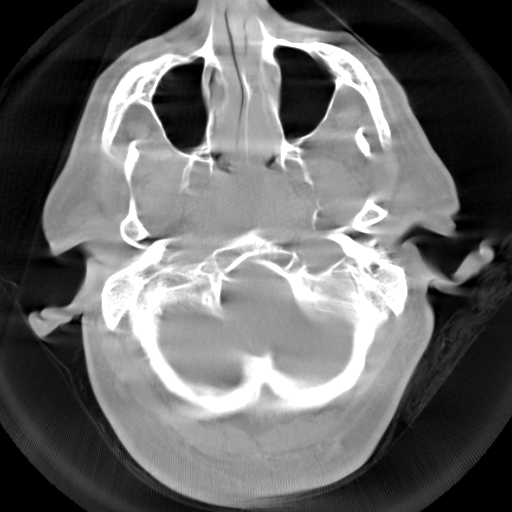
[im 24/40]
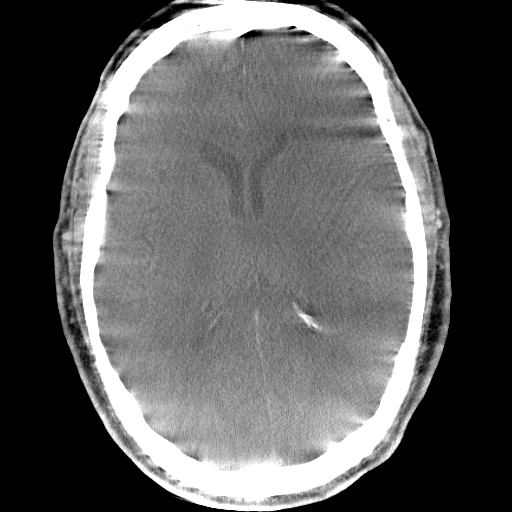
[im 40/40]
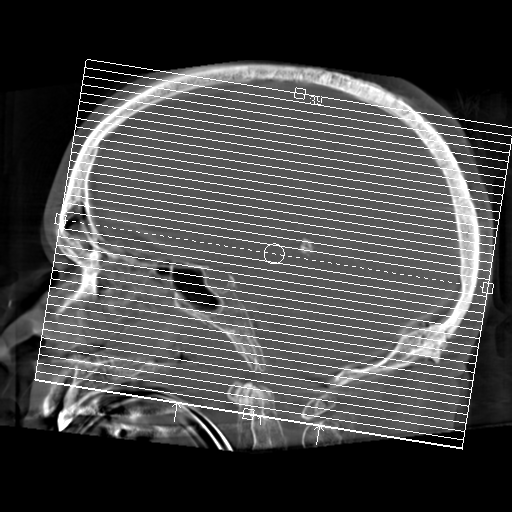

[Series 300: dr. (person_name) · 9 of 128 slices shown]
[im 8/128]
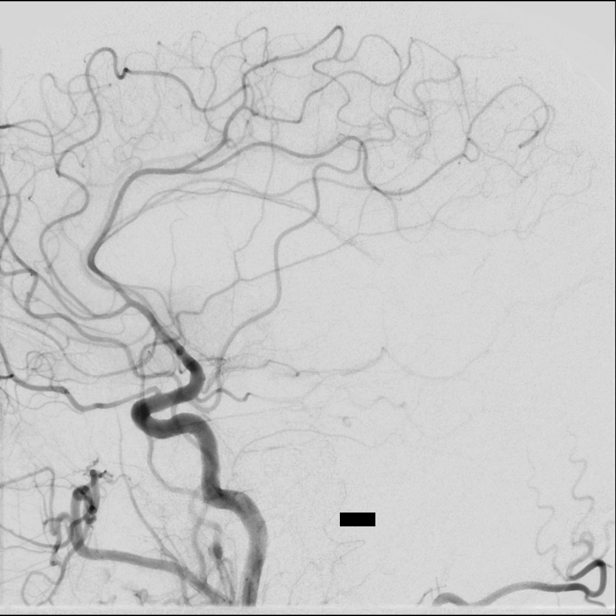
[im 23/128]
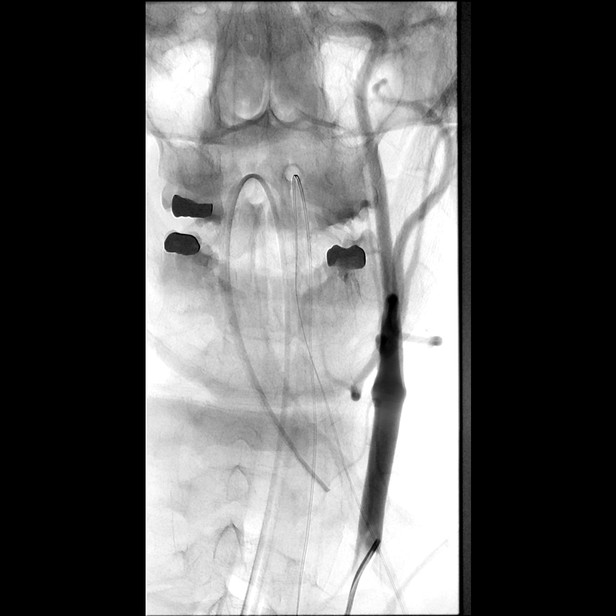
[im 38/128]
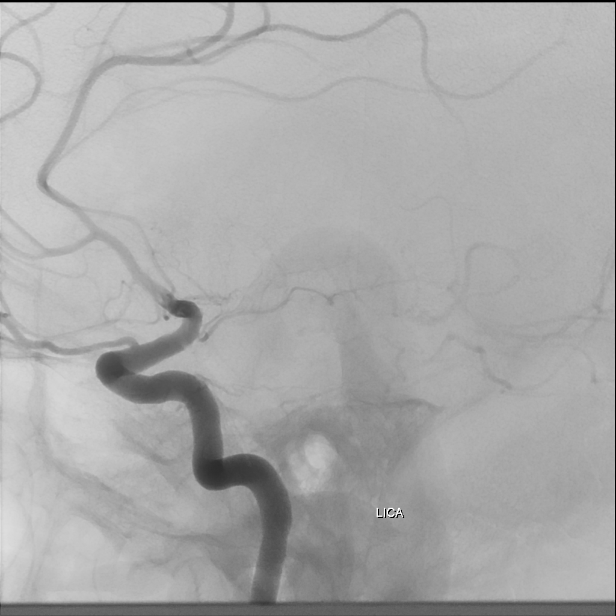
[im 53/128]
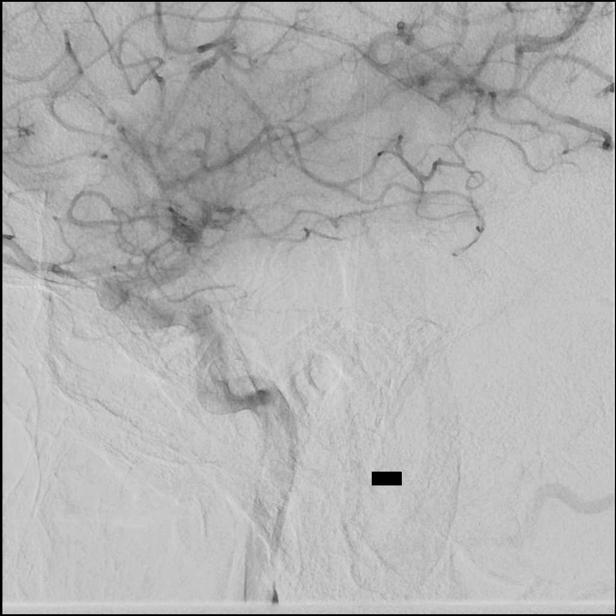
[im 68/128]
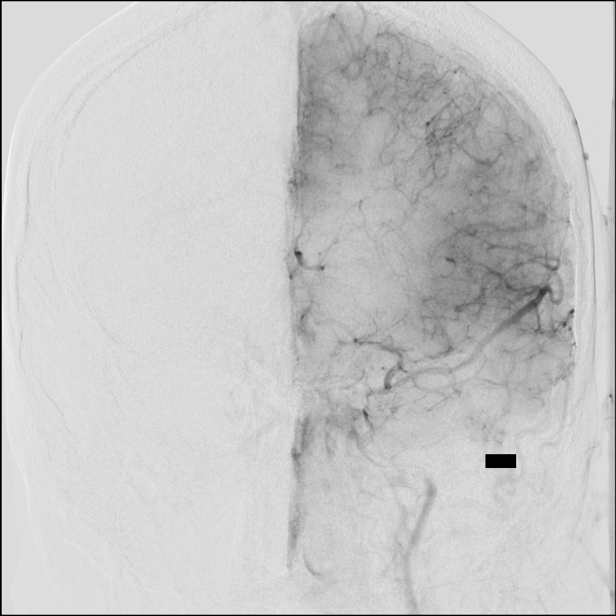
[im 83/128]
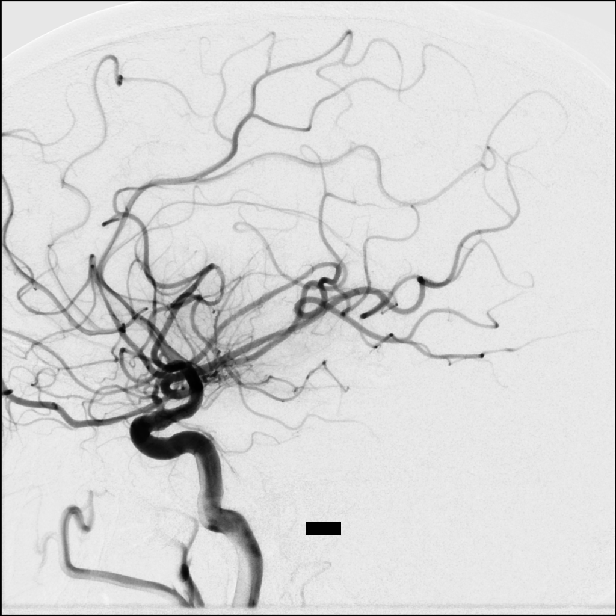
[im 98/128]
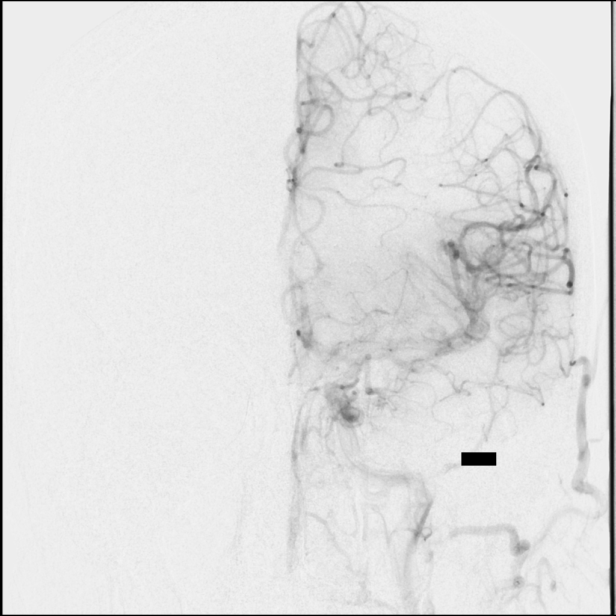
[im 113/128]
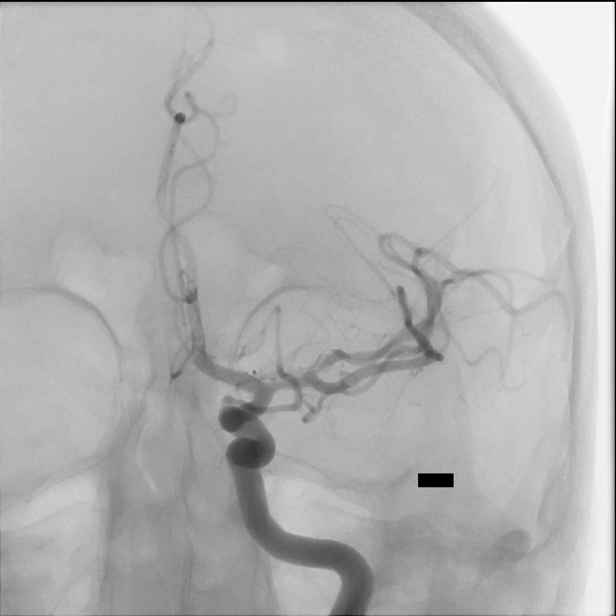
[im 128/128]
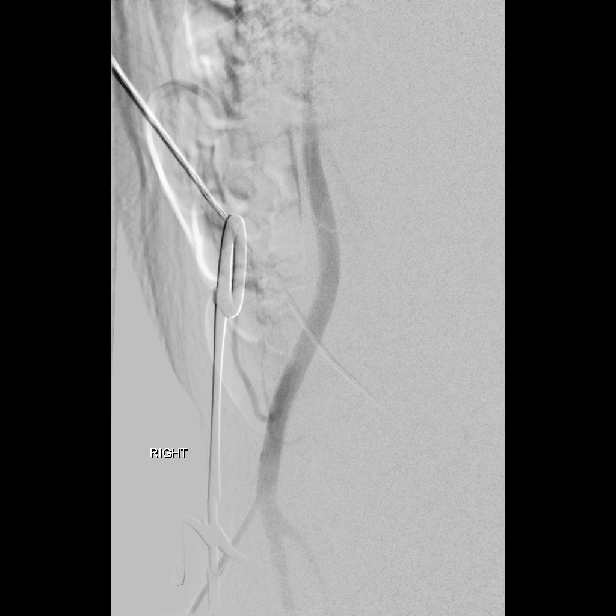

[12 of 24 positions shown; findings below may reference images not displayed]

MEDICATIONS:
Ancef 2 g IV antibiotic was administered within 1 hour of the
procedure.

ANESTHESIA/SEDATION:
General anesthesia.

CONTRAST:  Isovue 300 approximately 60 mL.

FLUOROSCOPY TIME:  Fluoroscopy Time: 18 minutes 36 seconds ([99]
mGy).

COMPLICATIONS:
None immediate.
The patient was then put under general anesthesia by the [REDACTED] at [HOSPITAL].

The right groin was prepped and draped in the usual sterile fashion.
Thereafter using modified Seldinger technique, transfemoral access
into the right common femoral artery was obtained without
difficulty. Over a 0.035 inch guidewire a 5 French Pinnacle sheath
was inserted. Through this, and also over a 0.035 inch guidewire a 5
French JB 1 catheter was advanced to the aortic arch region and
selectively positioned in the left common carotid artery.
FINDINGS: The left common carotid arteriogram demonstrates the left external
carotid artery and its major branches to be widely patent.

The left internal carotid artery at the bulb to the cranial skull
base is widely patent.

The petrous, cavernous and supraclinoid segments are widely patent.

Complete occlusion is seen of the left internal carotid artery
terminus extending into the left middle cerebral artery and the left
anterior cerebral artery proximally.

The left posterior communicating artery is seen to be patent.

PROCEDURE:
The diagnostic JB 1 catheter in the left common carotid artery was
exchanged over a 0.035 inch 300 cm Rosen exchange guidewire for an 8
French Pinnacle sheath in the right groin and connected to
continuous heparinized saline infusion.

Over the exchange guidewire, an 087 Walrus balloon guide catheter
which had been prepped with 50% contrast and 50% heparinized saline
infusion was advanced and positioned in the distal left internal
carotid artery.

The guidewire was removed. Good aspiration obtained from the hub of
the balloon guide catheter. A gentle control arteriogram performed
through this continued to demonstrate no change in the intracranial
circulation.

A combination of a 6 French 132 cm Catalyst guide catheter inside of
which was an 021 Trevo ProVue microcatheter combination was advanced
over a 0.014 inch standard Synchro micro guidewire to the distal end
of the Catalyst guide catheter. With the micro guidewire leading
with a J-tip configuration to avoid dissections or spasm, access was
obtained into the inferior division of the left middle cerebral
artery M2 M3 region followed by the microcatheter. The guidewire was
removed. Good aspiration was obtained from the hub of the
microcatheter. A gentle contrast injection demonstrated safe
position of the tip of the microcatheter. This was then connected to
continuous heparinized saline infusion. The 6 French Catalyst guide
catheter was now advanced to the left internal carotid artery and
just inside of the left middle cerebral artery.

A 4 mm x 40 mm Solitaire X retrieval device was then advanced to the
distal end of the microcatheter. The O ring on the delivery
microcatheter was then loosened. With slight forward gentle traction
with the right hand on the delivery micro guidewire, the
microcatheter was retrieved unsheathing the device.

With constant aspiration being applied at the hub of the 6 French
Catalyst guide catheter using a Penumbra aspiration device, and
proximal flow arrest in the left internal carotid artery, and also
with a 60 mL syringe for aspiration of the balloon guide catheter.
The combination of the retrieval device, the microcatheter and a 6
French Catalyst guide catheter were retrieved following aspiration
less than about 2-1/2 minutes. The combination was retrieved and
removed. The aspiration of the balloon guide catheter was maintained
as flow arrest was then reversed.

Chunks of clot were seen entangled in the interstices of the
retrieval device.

A control arteriogram performed through the balloon guide catheter
in the left internal carotid artery demonstrated complete
angiographic revascularization of the left MCA distribution.

The left anterior cerebral artery and the left posterior
communicating artery remain widely patent.

No evidence of extravasation or mass-effect or midline shift was
noted.

The balloon guide was then retrieved into the left common carotid
artery. A final control arteriogram performed through this
demonstrated wide patency of the left middle cerebral artery, the
left anterior cerebral artery and the left posterior communicating
artery regions. The intracranial and the extracranial left ICA
remained unremarkable.

The Walrus balloon guide catheter was removed. The 8 French Pinnacle
sheath in the right groin was then removed with the successful
application of an 8 French Angio-Seal closure device. The right
groin appeared soft without evidence of hematoma or bleeding.

Distal pulses remained palpable in the dorsalis pedis, and the
posterior tibial regions bilaterally.

A flat panel CT of the brain revealed no evidence of intracranial
hemorrhages, mass-effect or midline shift.

The patient was left intubated on account of his [99]
positivity.
IMPRESSION: Status post endovascular complete revascularization of occluded left
internal carotid terminus, left middle cerebral artery and the left
anterior cerebral artery with 1 pass using the 4 mm x 40 mm
Solitaire X retrieval device, and a Penumbra aspiration achieving a
TICI 3 revascularization.

PLAN:
Follow-up in the clinic in 4 weeks post discharge.

## 2019-07-20 IMAGING — CT CT ANGIO NECK
1 of 10 series · 5 of 33 positions shown · IV contrast (omnipaque)
Comparison: None.

CLINICAL DATA: Right-sided weakness

EXAM:
CT ANGIOGRAPHY HEAD AND NECK
TECHNIQUE: Multidetector CT imaging of the head and neck was performed using
the standard protocol during bolus administration of intravenous
contrast. Multiplanar CT image reconstructions and MIPs were
obtained to evaluate the vascular anatomy. Carotid stenosis
measurements (when applicable) are obtained utilizing NASCET
criteria, using the distal internal carotid diameter as the
denominator.
CONTRAST:  100mL OMNIPAQUE IOHEXOL 350 MG/ML SOLN

[Series 507: cta head & neck · axial · 0.39mm/px · z∈[+94,+341]mm · 5 of 750 slices shown]
[im 125/750  soft-tissue]
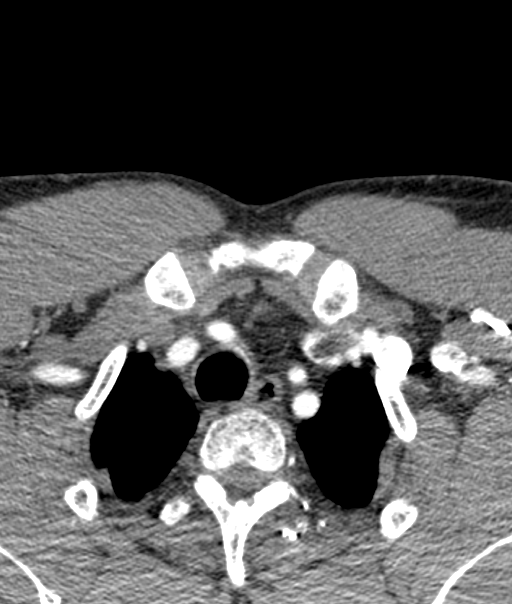
[im 250/750  bone]
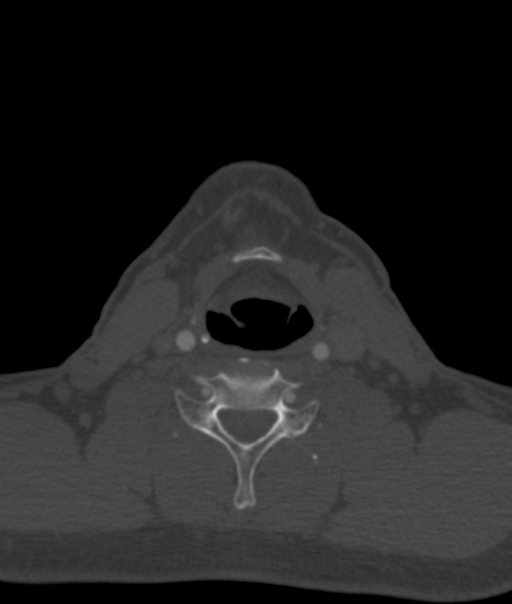
[im 375/750  soft-tissue]
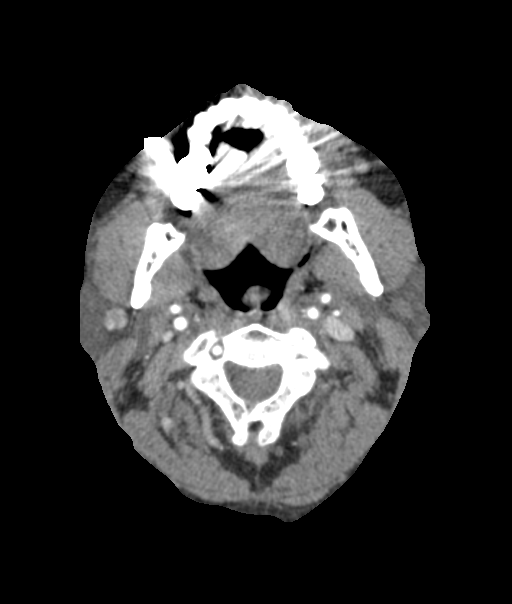
[im 500/750  bone]
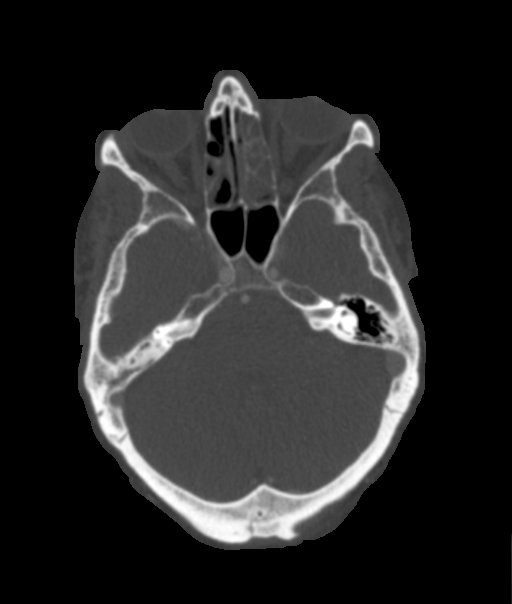
[im 625/750  soft-tissue]
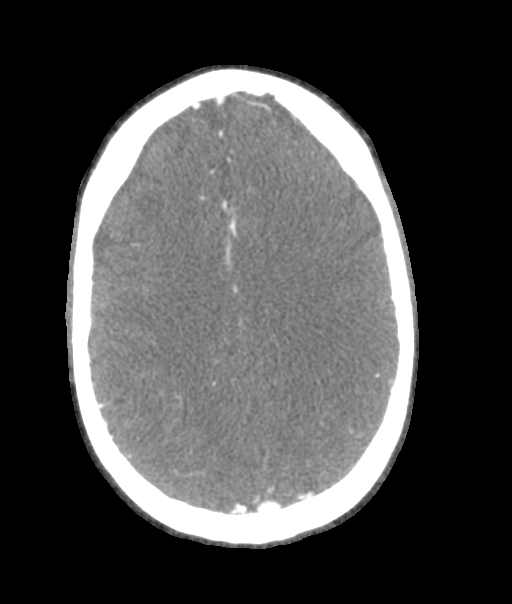

[5 of 33 positions shown; findings below may reference images not displayed]

FINDINGS: CTA NECK FINDINGS

Aortic arch: Common origin of the innominate and left common carotid
arteries. Great vessel origins are patent.

Right carotid system: Common, internal, and external carotid
arteries are patent. There is no measurable stenosis the proximal
ICA.

Left carotid system: Common, internal, and external carotid arteries
are patent. There is minimal calcified and noncalcified plaque at
the ICA origin without measurable stenosis.

Vertebral arteries: Patent. Left vertebral artery is dominant. No
stenosis or evidence of dissection.

Skeleton: No acute abnormality.

Other neck: No neck mass or adenopathy.

Upper chest: No apical lung mass.

Review of the MIP images confirms the above findings

CTA HEAD FINDINGS

Anterior circulation: Intracranial internal carotid arteries are
patent. There is loss of enhancement at the left carotid terminus
extending into the left M1 MCA. There is reconstitution M2 MCA
branches with diminished flow. Also enhancement also involves the
left ACA origin with subsequent reconstitution. Right middle and
anterior cerebral arteries are patent. There is a patent anterior
communicating artery.

Posterior circulation: Intracranial vertebral arteries, basilar
artery, and posterior cerebral arteries are patent.

Venous sinuses: Patent as permitted by contrast timing.

Anatomic variants: None

Review of the MIP images confirms the above findings
IMPRESSION: Thrombus at the left ICA terminus extending into A1 and M1 segments.
There is reconstitution of the left A1 likely via patent anterior
communicating artery. Reconstitution of more distal left MCA
branches with diminished flow.

No measurable stenosis in the neck.

## 2019-07-20 IMAGING — CT CT ANGIO HEAD
2 of 7 series · 8 of 33 positions shown · IV contrast (omnipaque)
Comparison: None.

CLINICAL DATA: Right-sided weakness

EXAM:
CT ANGIOGRAPHY HEAD AND NECK
TECHNIQUE: Multidetector CT imaging of the head and neck was performed using
the standard protocol during bolus administration of intravenous
contrast. Multiplanar CT image reconstructions and MIPs were
obtained to evaluate the vascular anatomy. Carotid stenosis
measurements (when applicable) are obtained utilizing NASCET
criteria, using the distal internal carotid diameter as the
denominator.
CONTRAST:  100mL OMNIPAQUE IOHEXOL 350 MG/ML SOLN

[Series 4: cta head & neck · axial · 0.49mm/px · z∈[+172,+298]mm · 2 of 189 slices shown]
[im 63/189  soft-tissue]
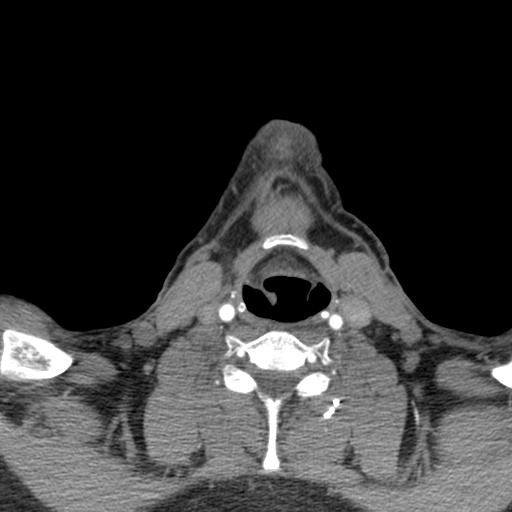
[im 126/189  soft-tissue]
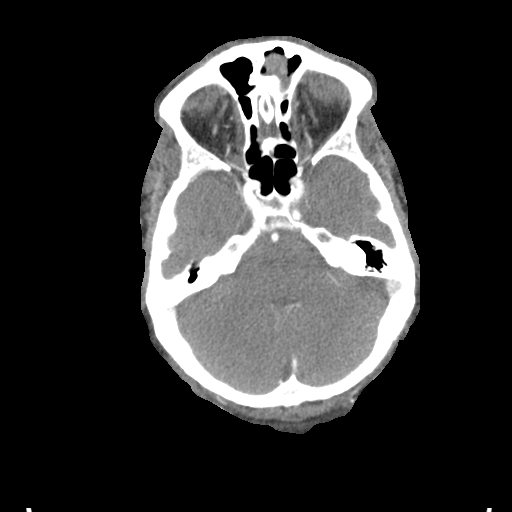

[Series 6: ax thin · axial · 0.39mm/px · z∈[+86,+350]mm · 6 of 375 slices shown]
[im 54/375  soft-tissue]
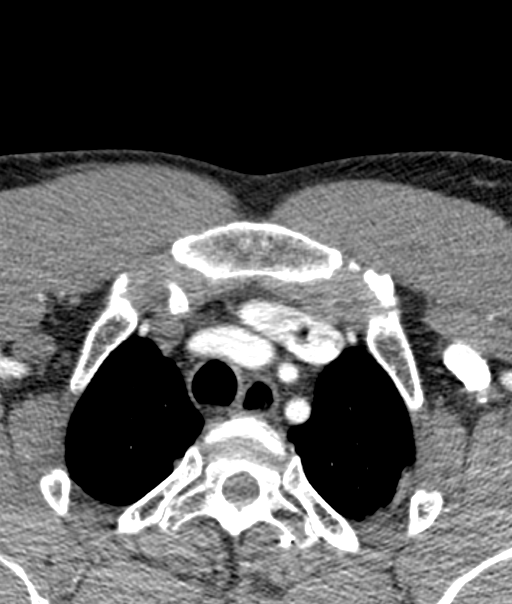
[im 107/375  bone]
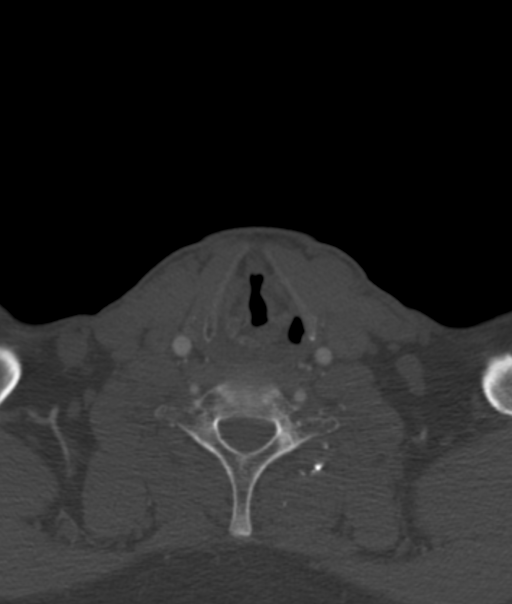
[im 161/375  soft-tissue]
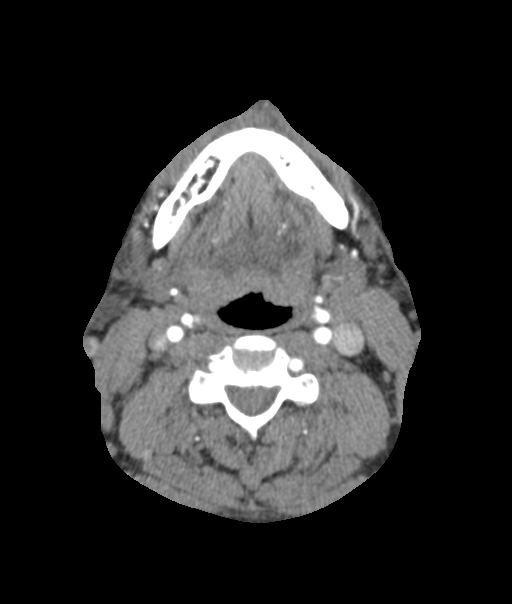
[im 214/375  bone]
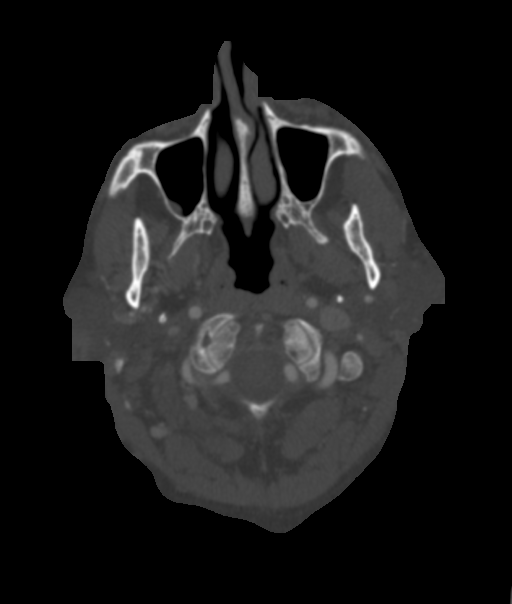
[im 268/375  soft-tissue]
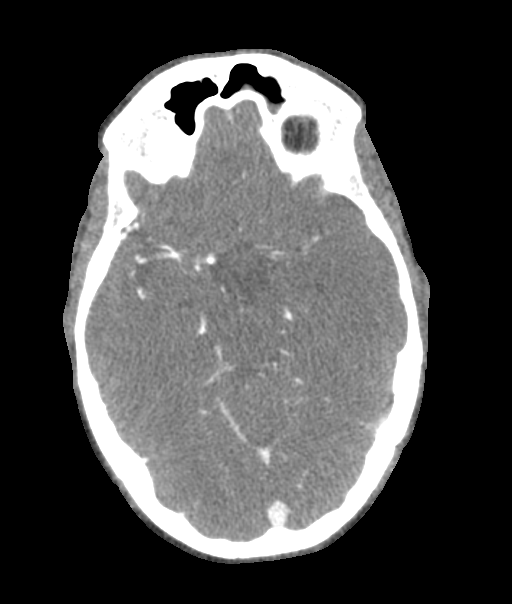
[im 321/375  bone]
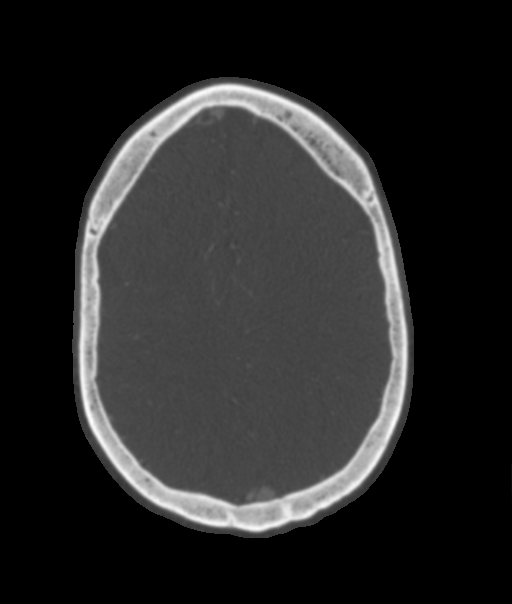

[8 of 33 positions shown; findings below may reference images not displayed]

FINDINGS: CTA NECK FINDINGS

Aortic arch: Common origin of the innominate and left common carotid
arteries. Great vessel origins are patent.

Right carotid system: Common, internal, and external carotid
arteries are patent. There is no measurable stenosis the proximal
ICA.

Left carotid system: Common, internal, and external carotid arteries
are patent. There is minimal calcified and noncalcified plaque at
the ICA origin without measurable stenosis.

Vertebral arteries: Patent. Left vertebral artery is dominant. No
stenosis or evidence of dissection.

Skeleton: No acute abnormality.

Other neck: No neck mass or adenopathy.

Upper chest: No apical lung mass.

Review of the MIP images confirms the above findings

CTA HEAD FINDINGS

Anterior circulation: Intracranial internal carotid arteries are
patent. There is loss of enhancement at the left carotid terminus
extending into the left M1 MCA. There is reconstitution M2 MCA
branches with diminished flow. Also enhancement also involves the
left ACA origin with subsequent reconstitution. Right middle and
anterior cerebral arteries are patent. There is a patent anterior
communicating artery.

Posterior circulation: Intracranial vertebral arteries, basilar
artery, and posterior cerebral arteries are patent.

Venous sinuses: Patent as permitted by contrast timing.

Anatomic variants: None

Review of the MIP images confirms the above findings
IMPRESSION: Thrombus at the left ICA terminus extending into A1 and M1 segments.
There is reconstitution of the left A1 likely via patent anterior
communicating artery. Reconstitution of more distal left MCA
branches with diminished flow.

No measurable stenosis in the neck.

## 2019-07-20 IMAGING — DX DG CHEST 1V PORT
1 series · 1 of 1 positions shown · non-contrast
Comparison: None.

CLINICAL DATA: Check endotracheal tube placement

EXAM:
PORTABLE CHEST 1 VIEW

[chest ap]
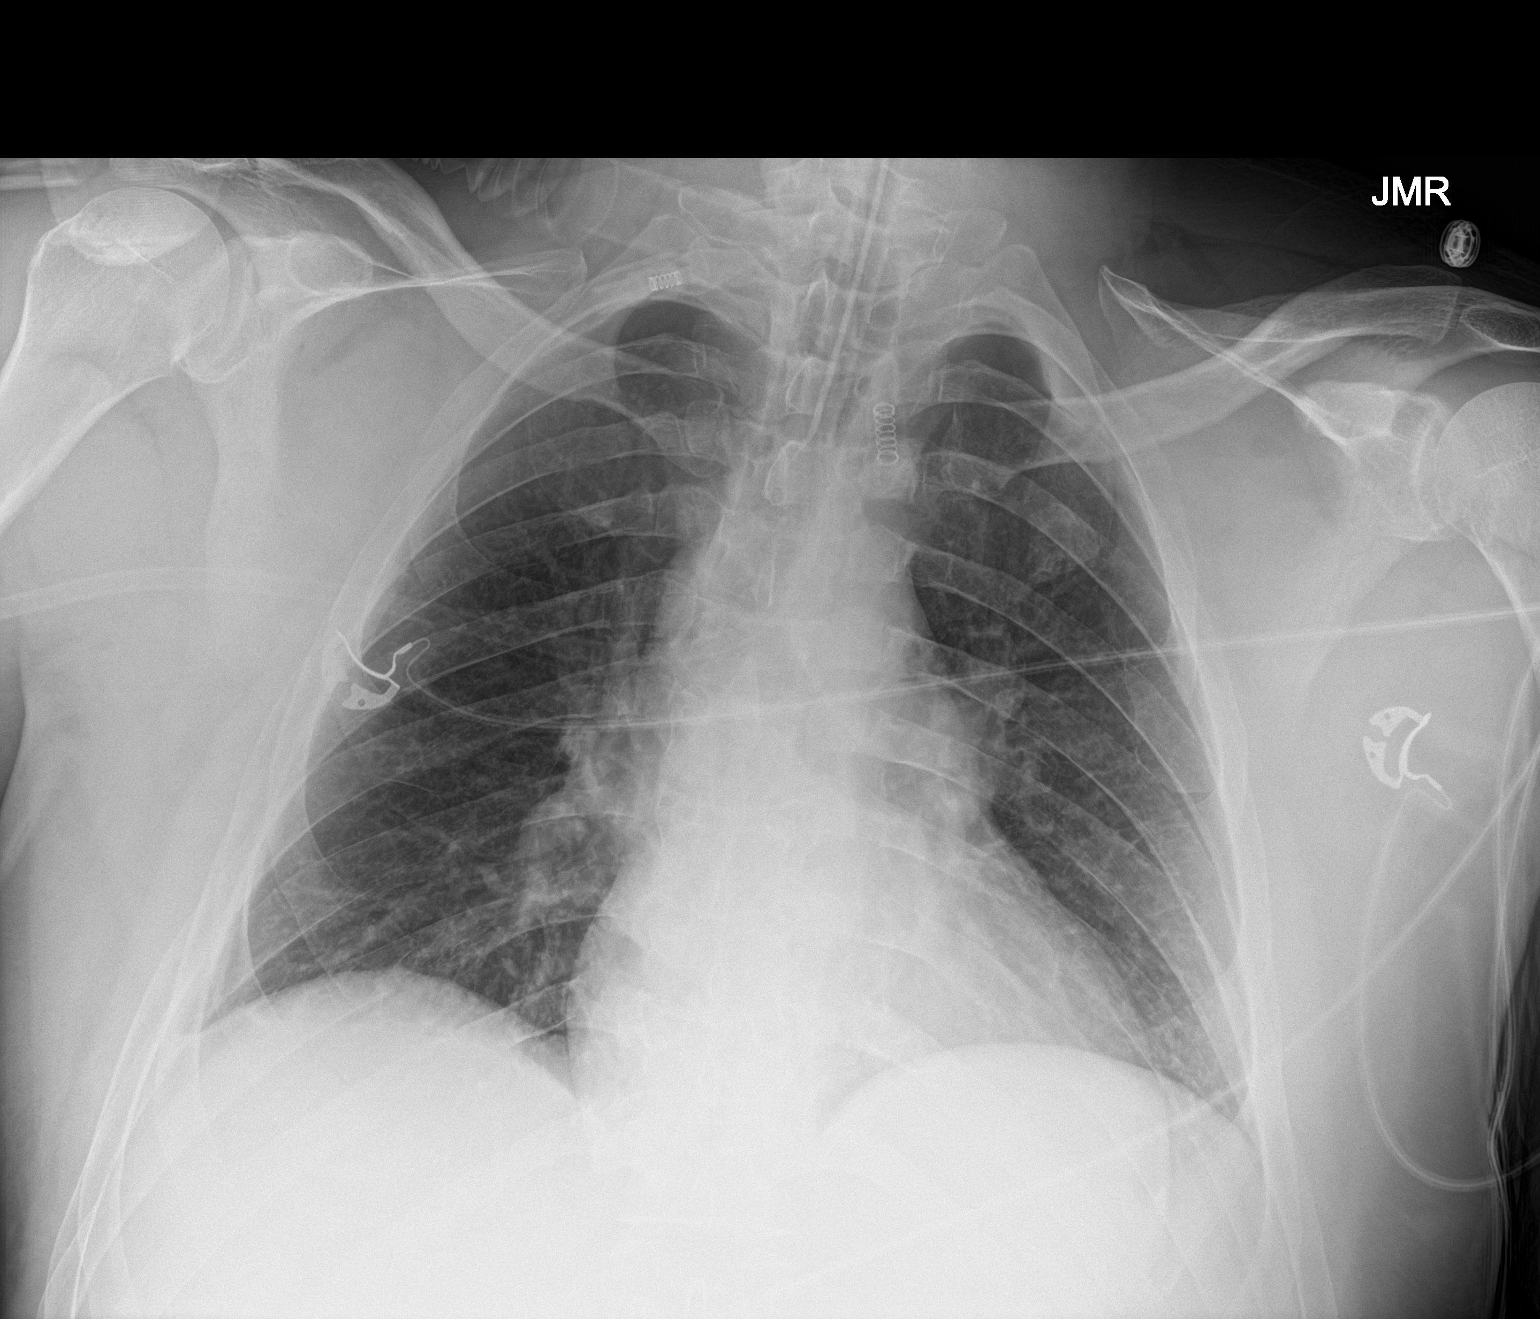

[1 of 1 positions shown; findings below may reference images not displayed]

FINDINGS: Endotracheal tube is noted 5.6 cm above the carina. Cardiac shadows
within normal limits. The lungs are clear bilaterally. No acute bony
abnormality is seen.
IMPRESSION: Endotracheal tube as described.  No acute abnormality noted.

## 2019-07-20 SURGERY — IR WITH ANESTHESIA
Anesthesia: General

## 2019-07-20 MED ORDER — ALTEPLASE 100 MG IV SOLR
INTRAVENOUS | Status: AC
  Administered 2019-07-20: 08:00:00 76.5 mg via INTRAVENOUS
  Filled 2019-07-20: qty 100

## 2019-07-20 MED ORDER — MIDAZOLAM HCL 2 MG/2ML IJ SOLN
INTRAMUSCULAR | Status: AC
  Filled 2019-07-20: qty 2

## 2019-07-20 MED ORDER — ACETAMINOPHEN 160 MG/5ML PO SOLN: 650.0000 mg | ORAL | Status: DC | PRN

## 2019-07-20 MED ORDER — STROKE: EARLY STAGES OF RECOVERY BOOK
Freq: Once | Status: DC
  Filled 2019-07-20 (×2): qty 1

## 2019-07-20 MED ORDER — PROPOFOL 10 MG/ML IV BOLUS
INTRAVENOUS | Status: DC | PRN
  Administered 2019-07-20: 150 mg via INTRAVENOUS

## 2019-07-20 MED ORDER — IOHEXOL 350 MG/ML SOLN
100.0000 mL | Freq: Once | INTRAVENOUS | Status: AC | PRN
  Administered 2019-07-20: 100 mL via INTRAVENOUS

## 2019-07-20 MED ORDER — SODIUM CHLORIDE 0.9 % IV SOLN: 50.0000 mL | Freq: Once | INTRAVENOUS | Status: DC

## 2019-07-20 MED ORDER — SENNOSIDES-DOCUSATE SODIUM 8.6-50 MG PO TABS: 1.0000 | ORAL_TABLET | Freq: Every evening | ORAL | Status: DC | PRN

## 2019-07-20 MED ORDER — ONDANSETRON HCL 4 MG/2ML IJ SOLN: 4.0000 mg | Freq: Four times a day (QID) | INTRAMUSCULAR | Status: DC | PRN

## 2019-07-20 MED ORDER — SODIUM CHLORIDE 0.9 % IV SOLN
INTRAVENOUS | Status: DC | PRN
  Administered 2019-07-20: 09:00:00 via INTRAVENOUS

## 2019-07-20 MED ORDER — ONDANSETRON HCL 4 MG/2ML IJ SOLN
INTRAMUSCULAR | Status: DC | PRN
  Administered 2019-07-20: 4 mg via INTRAVENOUS

## 2019-07-20 MED ORDER — IOHEXOL 300 MG/ML  SOLN
150.0000 mL | Freq: Once | INTRAMUSCULAR | Status: AC | PRN
  Administered 2019-07-20: 75 mL via INTRA_ARTERIAL

## 2019-07-20 MED ORDER — CLEVIDIPINE BUTYRATE 0.5 MG/ML IV EMUL
0.0000 mg/h | INTRAVENOUS | Status: DC
  Filled 2019-07-20: qty 50

## 2019-07-20 MED ORDER — FENTANYL CITRATE (PF) 100 MCG/2ML IJ SOLN
INTRAMUSCULAR | Status: AC
  Filled 2019-07-20: qty 2

## 2019-07-20 MED ORDER — ALTEPLASE (STROKE) FULL DOSE INFUSION
0.9000 mg/kg | Freq: Once | INTRAVENOUS | Status: AC
  Administered 2019-07-20: 08:00:00 76.5 mg via INTRAVENOUS
  Filled 2019-07-20: qty 100

## 2019-07-20 MED ORDER — PROPOFOL 500 MG/50ML IV EMUL
INTRAVENOUS | Status: DC | PRN
  Administered 2019-07-20: 50 ug/kg/min via INTRAVENOUS

## 2019-07-20 MED ORDER — SODIUM CHLORIDE 0.9 % IV SOLN
INTRAVENOUS | Status: DC
  Administered 2019-07-20 – 2019-07-21 (×2): via INTRAVENOUS

## 2019-07-20 MED ORDER — CEFAZOLIN SODIUM-DEXTROSE 2-3 GM-%(50ML) IV SOLR
INTRAVENOUS | Status: DC | PRN
  Administered 2019-07-20: 2 g via INTRAVENOUS

## 2019-07-20 MED ORDER — LIDOCAINE 2% (20 MG/ML) 5 ML SYRINGE
INTRAMUSCULAR | Status: DC | PRN
  Administered 2019-07-20: 80 mg via INTRAVENOUS

## 2019-07-20 MED ORDER — ACETAMINOPHEN 650 MG RE SUPP: 650.0000 mg | RECTAL | Status: DC | PRN

## 2019-07-20 MED ORDER — ACETAMINOPHEN 325 MG PO TABS: 650.0000 mg | ORAL_TABLET | ORAL | Status: DC | PRN

## 2019-07-20 MED ORDER — SODIUM CHLORIDE 0.9 % IV SOLN: INTRAVENOUS | Status: DC

## 2019-07-20 MED ORDER — NITROGLYCERIN 1 MG/10 ML FOR IR/CATH LAB
INTRA_ARTERIAL | Status: AC | PRN
  Administered 2019-07-20 (×5): 25 ug via INTRA_ARTERIAL

## 2019-07-20 MED ORDER — ROCURONIUM BROMIDE 10 MG/ML (PF) SYRINGE
PREFILLED_SYRINGE | INTRAVENOUS | Status: DC | PRN
  Administered 2019-07-20: 10 mg via INTRAVENOUS
  Administered 2019-07-20: 20 mg via INTRAVENOUS
  Administered 2019-07-20: 40 mg via INTRAVENOUS

## 2019-07-20 MED ORDER — FENTANYL CITRATE (PF) 100 MCG/2ML IJ SOLN: 50.0000 ug | Freq: Once | INTRAMUSCULAR | Status: DC

## 2019-07-20 MED ORDER — FENTANYL CITRATE (PF) 100 MCG/2ML IJ SOLN
INTRAMUSCULAR | Status: DC | PRN
  Administered 2019-07-20 (×2): 50 ug via INTRAVENOUS

## 2019-07-20 MED ORDER — FENTANYL CITRATE (PF) 100 MCG/2ML IJ SOLN
INTRAMUSCULAR | Status: AC
  Administered 2019-07-20: 100 ug via INTRAVENOUS
  Filled 2019-07-20: qty 2

## 2019-07-20 MED ORDER — PANTOPRAZOLE SODIUM 40 MG IV SOLR
40.0000 mg | Freq: Every day | INTRAVENOUS | Status: DC
  Administered 2019-07-20: 21:00:00 40 mg via INTRAVENOUS
  Filled 2019-07-20: qty 40

## 2019-07-20 MED ORDER — FENTANYL BOLUS VIA INFUSION
50.0000 ug | INTRAVENOUS | Status: DC | PRN
  Filled 2019-07-20: qty 50

## 2019-07-20 MED ORDER — SUCCINYLCHOLINE CHLORIDE 200 MG/10ML IV SOSY
PREFILLED_SYRINGE | INTRAVENOUS | Status: DC | PRN
  Administered 2019-07-20: 100 mg via INTRAVENOUS

## 2019-07-20 MED ORDER — FENTANYL 2500MCG IN NS 250ML (10MCG/ML) PREMIX INFUSION
50.0000 ug/h | INTRAVENOUS | Status: DC
  Administered 2019-07-20: 50 ug/h via INTRAVENOUS
  Filled 2019-07-20: qty 250

## 2019-07-20 MED ORDER — PHENYLEPHRINE HCL-NACL 10-0.9 MG/250ML-% IV SOLN
INTRAVENOUS | Status: DC | PRN
  Administered 2019-07-20: 10 ug/min via INTRAVENOUS

## 2019-07-20 MED ORDER — ORAL CARE MOUTH RINSE
15.0000 mL | Freq: Two times a day (BID) | OROMUCOSAL | Status: DC
  Administered 2019-07-21 – 2019-07-22 (×2): 15 mL via OROMUCOSAL

## 2019-07-20 MED ORDER — CHLORHEXIDINE GLUCONATE CLOTH 2 % EX PADS
6.0000 | MEDICATED_PAD | Freq: Every day | CUTANEOUS | Status: DC
  Administered 2019-07-20 – 2019-07-21 (×2): 6 via TOPICAL

## 2019-07-20 MED ORDER — PROPOFOL 1000 MG/100ML IV EMUL
0.0000 ug/kg/min | INTRAVENOUS | Status: DC
  Administered 2019-07-20: 14:00:00 30 ug/kg/min via INTRAVENOUS
  Administered 2019-07-20: 50 ug/kg/min via INTRAVENOUS
  Filled 2019-07-20: qty 100

## 2019-07-20 MED ORDER — FENTANYL CITRATE (PF) 100 MCG/2ML IJ SOLN
100.0000 ug | Freq: Once | INTRAMUSCULAR | Status: AC
  Administered 2019-07-20: 12:00:00 100 ug via INTRAVENOUS

## 2019-07-20 MED ORDER — CHLORHEXIDINE GLUCONATE 0.12% ORAL RINSE (MEDLINE KIT)
15.0000 mL | Freq: Two times a day (BID) | OROMUCOSAL | Status: DC
  Administered 2019-07-20: 13:00:00 15 mL via OROMUCOSAL

## 2019-07-20 MED ORDER — PROPOFOL 1000 MG/100ML IV EMUL
INTRAVENOUS | Status: AC
  Filled 2019-07-20: qty 100

## 2019-07-20 MED ORDER — ORAL CARE MOUTH RINSE
15.0000 mL | OROMUCOSAL | Status: DC
  Administered 2019-07-20 (×2): 15 mL via OROMUCOSAL

## 2019-07-20 MED ORDER — DEXAMETHASONE SODIUM PHOSPHATE 10 MG/ML IJ SOLN
INTRAMUSCULAR | Status: DC | PRN
  Administered 2019-07-20: 4 mg via INTRAVENOUS

## 2019-07-20 MED ORDER — MIDAZOLAM HCL 5 MG/5ML IJ SOLN
INTRAMUSCULAR | Status: DC | PRN
  Administered 2019-07-20 (×2): 1 mg via INTRAVENOUS

## 2019-07-20 NOTE — Sedation Documentation (Signed)
Patient under care of CRNA. This RN in room to assist as needed 

## 2019-07-20 NOTE — Transfer of Care (Signed)
Immediate Anesthesia Transfer of Care Note  Patient: Matthew Stanton  Procedure(s) Performed: IR WITH ANESTHESIA (N/A )  Patient Location: ICU  Anesthesia Type:General  Level of Consciousness: unresponsive and Patient remains intubated per anesthesia plan  Airway & Oxygen Therapy: Patient remains intubated per anesthesia plan and Patient placed on Ventilator (see vital sign flow sheet for setting)  Post-op Assessment: Report given to RN, Post -op Vital signs reviewed and stable and Patient moving all extremities  Post vital signs: Reviewed and stable  Last Vitals:  Vitals Value Taken Time  BP 175/96 07/20/19 1111  Temp    Pulse 65 07/20/19 1118  Resp 17 07/20/19 1118  SpO2 100 % 07/20/19 1118  Vitals shown include unvalidated device data.  Last Pain: There were no vitals filed for this visit.       Complications: No apparent anesthesia complications

## 2019-07-20 NOTE — Plan of Care (Signed)
  Problem: Education: Goal: Knowledge of disease or condition will improve Outcome: Progressing   Problem: Self-Care: Goal: Ability to participate in self-care as condition permits will improve Outcome: Progressing   Problem: Ischemic Stroke/TIA Tissue Perfusion: Goal: Complications of ischemic stroke/TIA will be minimized Outcome: Progressing   

## 2019-07-20 NOTE — ED Triage Notes (Addendum)
Pt brought in by EMS. Code stroke called. Per EMS wife reported that she has been staying at another residence due to pt being positive for covid on Friday. Pt wife last saw him 630 pm last night She went to check on pt and feed chickens. Spoke to pt at 0640 and he was fine. Unable to get in house  she heard a thump and checked on him and he had fallen at that time he was non verbal and could not use his right side . Per EMS was able to follow commands.Pt assessed by EDP and went straight to CT

## 2019-07-20 NOTE — ED Notes (Signed)
CT scanner went down during CT perfusion. RN made the call to transport pt back to ED because plain film CT and CT Angio were already done. Dr. Lacinda Axon called and notified upon transfer.

## 2019-07-20 NOTE — Anesthesia Postprocedure Evaluation (Signed)
Anesthesia Post Note  Patient: Matthew Stanton  Procedure(s) Performed: IR WITH ANESTHESIA (N/A )     Patient location during evaluation: SICU Anesthesia Type: General Level of consciousness: sedated Pain management: pain level controlled Vital Signs Assessment: post-procedure vital signs reviewed and stable Respiratory status: patient remains intubated per anesthesia plan Cardiovascular status: stable Postop Assessment: no apparent nausea or vomiting Anesthetic complications: no    Last Vitals:  Vitals:   07/20/19 1120 07/20/19 1130  BP: (!) 129/94   Pulse: (!) 57 (!) 52  Resp: 12 14  Temp:    SpO2: 100% 100%    Last Pain:  Vitals:   07/20/19 1115  TempSrc: Oral                 Aniello Christopoulos S

## 2019-07-20 NOTE — ED Notes (Signed)
IV pump not working properly during time of attempted first administration of TPA. IV pump reading "air in line", no air in line seen by 2 different RN. Another IV pump was brought to room and TPA was loaded onto it. TPA then initiated at [place].

## 2019-07-20 NOTE — Sedation Documentation (Signed)
Patient transported to 2M06. Report, orders, groin site and pulses reviewed with RN.

## 2019-07-20 NOTE — Progress Notes (Signed)
Pt is starting to wake up and follow commands, per CCM- start SBT

## 2019-07-20 NOTE — Code Documentation (Addendum)
48yo male arriving to [facility] [place] via [place] as a transfer from [place]. Patient with CTA at [facility] showing thrombus at the left ICA terminus extending into A1 and M1 segments. Patient was reportedly LKW at 0640 this morning. tPA administered at AP and patient transferred for endovascular intervention. Patient with h/o obstructive sleep apnea and recent COVID-19 infection. Patient transported directly to [place] by [facility] team on arrival to [facility]. Stroke team at the bedside on patient arrival. tPA complete and NS flush infusing per [facility] team. Dr. Cheral Marker at the bedside and completed exam. NIHSS 17, see documentation for details and code stroke times. Patient with right hemianopsia, right facial droop, right arm flaccid and global aphasia on exam.  Patient intubated by anesthesia in [place] [facility] and transferred into the IR suite. Bedside handoff with Legrand Como, [place] RN. Patient to be admitted to [facility] post-procedure.

## 2019-07-20 NOTE — Progress Notes (Signed)
Patient ID: Matthew Stanton, male   DOB: [birth year], 48 y.o.   MRN: [id number] INR. [place] 640 am mRSS 0 Covid positive. New onset of aphasia and RT sided weakness. CT LT MCA hyperdense sign. NO ICH ASPECTS 9. CTA occluded LT ICA terminus ,LT ACA prox and LT MCA.  Endovascular treatment D/W spouse. Reasons,risks alternatives reviewed. Risks of ICUH of 10 % ,worsenimng neuro deficit inability to revascularize  Death all reviewed. Spouse expressed understanding and provided telephonic consent to proceed. S.Sultana Tierney MD

## 2019-07-20 NOTE — Anesthesia Preprocedure Evaluation (Signed)
Anesthesia Evaluation  Patient identified by MRN, date of birth, ID band  Reviewed: Allergy & Precautions, NPO status , Patient's Chart, lab work & pertinent test resultsPreop documentation limited or incomplete due to emergent nature of procedure.  Airway Mallampati: II  TM Distance: >3 FB Neck ROM: Full    Dental no notable dental hx.    Pulmonary sleep apnea , former smoker,    Pulmonary exam normal breath sounds clear to auscultation       Cardiovascular negative cardio ROS Normal cardiovascular exam Rhythm:Regular Rate:Normal     Neuro/Psych CVA negative psych ROS   GI/Hepatic negative GI ROS, Neg liver ROS,   Endo/Other  negative endocrine ROS  Renal/GU negative Renal ROS  negative genitourinary   Musculoskeletal negative musculoskeletal ROS (+)   Abdominal   Peds negative pediatric ROS (+)  Hematology negative hematology ROS (+)   Anesthesia Other Findings   Reproductive/Obstetrics negative OB ROS                             Anesthesia Physical Anesthesia Plan  ASA: III and emergent  Anesthesia Plan: General   Post-op Pain Management:    Induction: Intravenous and Rapid sequence  PONV Risk Score and Plan: 2 and Ondansetron, Dexamethasone and Treatment may vary due to age or medical condition  Airway Management Planned: Oral ETT  Additional Equipment: Arterial line  Intra-op Plan:   Post-operative Plan: Possible Post-op intubation/ventilation  Informed Consent: I have reviewed the patients History and Physical, chart, labs and discussed the procedure including the risks, benefits and alternatives for the proposed anesthesia with the patient or authorized representative who has indicated his/her understanding and acceptance.     Dental advisory given  Plan Discussed with: CRNA  Anesthesia Plan Comments:         Anesthesia Quick Evaluation

## 2019-07-20 NOTE — ED Notes (Signed)
CT tech spoke with EDP and got orders to perform CT Angio and Perfusion at time of plain film CT of head prior to transferring pt back to ED.
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
## 2019-07-20 NOTE — Consult Note (Addendum)
[facility] TeleNeurology Consult Services   Date of Service:   07/20/2019 07:36:44  Impression:     .  Rule Out Acute Ischemic Stroke     .  Left MCA ischemic stroke.     Matthew Stanton  Acute aphasia and right hemiparesis.  Comments/Sign-Out: Matthew Stanton presents with acute aphasia and right hemiparesis. His wife reports that she briefly spoke with him at [phone] and then heard Matthew thump (when he fell) from another room. History obtained from his wife at the bedside. Past medical history is significant for being COVID positive (as of test on 07/15/19) and obstructive sleep apnea. He is not on anticoagulation. Note: CTA system reportedly not working this morning.  Mechanism of Stroke: Possible Thromboembolic Possible Cardioembolic  Metrics: Last Known Well: 07/20/2019 06:40:27 [facility] Notification Time: 07/20/2019 07:36:44 Arrival Time: 07/20/2019 07:20:29 Stamp Time: 07/20/2019 07:36:44 Time First Login Attempt: 07/20/2019 07:40:32 Video Start Time: 07/20/2019 07:40:32  Symptoms: acute speech difficulties and right sided weakness. NIHSS Start Assessment Time: 07/20/2019 07:54:57 Patient is Matthew candidate for Alteplase/Activase. Alteplase/Activase CPOE Order Time: 07/20/2019 08:04:17 Needle Time: 07/20/2019 08:13:57 Weight Noted by Staff: 187 lbs  CT head showed no acute hemorrhage or acute core infarct. CT head was reviewed and results were: 1. No acute intracranial hemorrhage or evidence of acute infarction. 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 2. ASPECTS is 10  Clinical Presentation is Suggestive of Large Vessel Occlusive Disease, Recommendations are as Follows  CTA Head and Neck.  Thrombus at the left ICA terminus extending into A1 and M1 segments. There is reconstitution of the left A1 likely via patent anterior communicating artery.  Reconstitution of more distal left MCA branches with diminished flow.  No measurable  stenosis in the neck.  ED Physician notified of diagnostic impression and management plan on 07/20/2019 08:02:38  Alteplase/Activase Contraindications:  Last Known Well > 4.5 hours: No CT Head showing hemorrhage: No Ischemic stroke within 3 months: No Severe head trauma within 3 months: No Intracranial/intraspinal surgery within 3 months: No History of intracranial hemorrhage: No Symptoms and signs consistent with an SAH: No GI malignancy or GI bleed within 21 days: No Coagulopathy: Platelets <100 000/mm3, INR >1.7, aPTT>40 s, or PT >15 s: No Treatment dose of LMWH within the previous 24 hrs: No Use of NOACs in past 48 hours: No Glycoprotein IIb/IIIa receptor inhibitors use: No Symptoms consistent with infective endocarditis: No Suspected aortic arch dissection: No Intra-axial intracranial neoplasm: No  Verbal Consent to Alteplase/Activase: I have explained to the Patient and Family the nature of the patient's condition, reviewed the indications and contraindications to the use of Alteplase/Activase fibrinolytic agent, reviewed the indications and contraindications and the benefits to be reasonably expected compared with alternative approaches. I have discussed the likelihood of major risks or complications of this procedure including (if applicable) but not limited to loss of limb function, brain damage, paralysis, hemorrhage, infection, complications from transfusion of blood components, drug reactions, blood clots and loss of life. I have also indicated that with any procedure there is always the possibility of an unexpected complication. All questions were answered and Patient and Family express understanding of the treatment plan and consent to the treatment.  Our recommendations are outlined below.  Recommendations: IV Alteplase/Activase recommended.  Alteplase/Activase bolus given Without Complication.   IV Alteplase/Activase Total Dose - 76.3 mg IV Alteplase/Activase Bolus  Dose - 7.6 mg IV Alteplase/Activase Infusion Dose - 68.7 mg  Routine post Alteplase/Activase monitoring including neuro checks  and blood pressure control during/after treatment Monitor blood pressure Check blood pressure and NIHSS every 15 min for 2 h, then every 30 min for 6 h, and finally every hour for 16 h.  Manage Blood Pressure per post Alteplase/Activase protocol.      .  Admission to ICU     .  CT brain 24 hours post Alteplase/Activase     .  NPO until swallowing screen performed and passed     .  No antiplatelet agents or anticoagulants (including heparin for DVT prophylaxis) in first 24 hours     .  No Foley catheter, nasogastric tube, arterial catheter or central venous catheter for 24 hr, unless absolutely necessary     .  Telemetry     .  Bedside swallow evaluation     .  HOB less than 30 degrees     .  Euglycemia     .  Avoid hyperthermia, PRN acetaminophen     .  DVT prophylaxis     .  Inpatient Neurology Consultation     .  Stroke evaluation as per inpatient neurology recommendations  Patient to be transferred to [facility] for INR evaluation of left MCA thrombus.  Discussed with ED physician    ------------------------------------------------------------------------------  History of Present Illness: Patient is Matthew Stanton.  Patient was brought by EMS for symptoms of acute speech difficulties and right sided weakness.  Matthew Stanton presents with acute aphasia and right hemiparesis. His wife reports that she briefly spoke with him at [phone] and then heard Matthew thump (when he fell) from another room. History obtained from his wife at the bedside. Past medical history is significant for being COVID positive (as of test on 07/15/19) and obstructive sleep apnea. He is not on anticoagulation.        Examination: BP(133/94), Pulse(65), Blood Glucose(108) 1A: Level of Consciousness - Alert; keenly responsive + 0 1B: Ask Month and Age - Aphasic + 2 1C: Blink  Eyes & Squeeze Hands - Performs Both Tasks + 0 2: Test Horizontal Extraocular Movements - Normal + 0 3: Test Visual Fields - Partial Hemianopia + 1 4: Test Facial Palsy (Use Grimace if Obtunded) - Partial paralysis (lower face) + 2 5A: Test Left Arm Motor Drift - No Drift for 10 Seconds + 0 5B: Test Right Arm Motor Drift - No Movement + 4 6A: Test Left Leg Motor Drift - No Drift for 5 Seconds + 0 6B: Test Right Leg Motor Drift - No Drift for 5 Seconds + 0 7: Test Limb Ataxia (FNF/Heel-Shin) - No Ataxia + 0 8: Test Sensation - Normal; No sensory loss + 0 9: Test Language/Aphasia - Normal; No aphasia + 0 10: Test Dysarthria - Normal + 0 11: Test Extinction/Inattention - Visual/tactile/auditory/spatial/personal inattention + 1  NIHSS Score: 10  Pre-Morbid Modified Ranking Scale: 0 Points = No symptoms at all  Patient/Family was informed the Neurology Consult would happen via TeleHealth consult by way of interactive audio and video telecommunications and consented to receiving care in this manner.   Due to the immediate potential for life-threatening deterioration due to underlying acute neurologic illness, I spent 55 minutes providing critical care. This time includes time for face to face visit via telemedicine, review of medical records, imaging studies and discussion of findings with providers, the patient and/or family.   Dr Ellin Goodie   [facility] [phone]  Case [id number]

## 2019-07-20 NOTE — Procedures (Signed)
S/P Lt common carotid arteriogram followed by complete revascularization of Lt ICA terminus and Lt MCA occlusion with x 1 pass with solitaire 6mmx 19mm x retriever device and penumbra suction achieving a TICI 3 revasculkarization

## 2019-07-20 NOTE — Procedures (Signed)
Extubation Procedure Note  Patient Details:   Name: Matthew Stanton DOB: 1971-07-26 MRN: [id number]   Airway Documentation:   + cuff leak test prior to extubation.  Vent end date: 07/20/19 Vent end time: 1635   Evaluation  O2 sats: stable throughout Complications: No apparent complications Patient did tolerate procedure well. Bilateral Breath Sounds: Other (Comment)(coarse)   Yes pt tol procedure well, pt able to speak.  No stridor noted.  VSS, no distress noted.   Lenna Sciara 07/20/2019, 4:42 PM

## 2019-07-20 NOTE — ED Notes (Signed)
Teleneuro cart would not turn on after multiple attempts. AC notified and she is bringing down the neurocart from ICU.

## 2019-07-20 NOTE — Anesthesia Procedure Notes (Signed)
Arterial Line Insertion Start/End11/18/2020 9:35 AM, 07/20/2019 9:40 AM Performed by: Myrtie Soman, MD, anesthesiologist  Patient location: OR. Preanesthetic checklist: patient identified, IV checked, site marked, risks and benefits discussed, surgical consent, monitors and equipment checked, pre-op evaluation, timeout performed and anesthesia consent Left, radial was placed Catheter size: 20 G Hand hygiene performed  and Seldinger technique used  Attempts: 2 Procedure performed without using ultrasound guided technique. Following insertion, dressing applied and Biopatch. Post procedure assessment: normal  Patient tolerated the procedure well with no immediate complications.

## 2019-07-20 NOTE — ED Notes (Addendum)
[facility] loading pt onto their stretcher at this time. [facility] RN notified of time for next vitals and neuro checks is 0845.

## 2019-07-20 NOTE — Progress Notes (Signed)
Patient ID: Matthew Stanton, male   DOB: 1971/03/27, 48 y.o.   MRN: [id number] INR . Post treament CT Brain N O ICH or mass effect. Patient left intubated. RT groin puncture site seale dwith an 86F angioseal device. Distal pulses DP and PT palpable bilaterally. S.Krishna Dancel MD
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
## 2019-07-20 NOTE — Anesthesia Procedure Notes (Signed)
Procedure Name: Intubation Date/Time: 07/20/2019 9:25 AM Performed by: Moshe Salisbury, CRNA Pre-anesthesia Checklist: Patient identified, Emergency Drugs available, Suction available and Patient being monitored Patient Re-evaluated:Patient Re-evaluated prior to induction Oxygen Delivery Method: Circle System Utilized Preoxygenation: Pre-oxygenation with 100% oxygen Induction Type: IV induction Ventilation: Mask ventilation without difficulty Laryngoscope Size: Glidescope and 4 Tube type: Oral Tube size: 7.5 mm Number of attempts: 1 Airway Equipment and Method: Rigid stylet and Video-laryngoscopy Placement Confirmation: ETT inserted through vocal cords under direct vision,  positive ETCO2 and breath sounds checked- equal and bilateral Secured at: 24 cm Tube secured with: Tape Dental Injury: Teeth and Oropharynx as per pre-operative assessment

## 2019-07-20 NOTE — Progress Notes (Signed)
CODE STOKE TIME DOCUMENTATION CALL TIME 704 BEEPER TIME 713 EXAM TIME 717 EXAM FINISHED 720 EXAM COMPLETED IN Epic 720 [place] CALLED 720  INJECTOR WENT DOWN AFTER CTA AND UNABLE TO PERFORM PERFUSION, BECAUSE THEAY DIDN'T WQANT TO WAIT FOR TECH TO REBOOT

## 2019-07-21 ENCOUNTER — Inpatient Hospital Stay (HOSPITAL_COMMUNITY): Payer: 59

## 2019-07-21 ENCOUNTER — Encounter (HOSPITAL_COMMUNITY): Payer: Self-pay | Admitting: Interventional Radiology

## 2019-07-21 ENCOUNTER — Other Ambulatory Visit: Payer: Self-pay | Admitting: Cardiology

## 2019-07-21 DIAGNOSIS — I639 Cerebral infarction, unspecified: Secondary | ICD-10-CM

## 2019-07-21 DIAGNOSIS — I6602 Occlusion and stenosis of left middle cerebral artery: Secondary | ICD-10-CM

## 2019-07-21 DIAGNOSIS — U071 COVID-19: Secondary | ICD-10-CM

## 2019-07-21 DIAGNOSIS — I63312 Cerebral infarction due to thrombosis of left middle cerebral artery: Secondary | ICD-10-CM

## 2019-07-21 LAB — GLUCOSE, CAPILLARY
Glucose-Capillary: 107 mg/dL — ABNORMAL HIGH (ref 70–99)
Glucose-Capillary: 137 mg/dL — ABNORMAL HIGH (ref 70–99)
Glucose-Capillary: 63 mg/dL — ABNORMAL LOW (ref 70–99)
Glucose-Capillary: 81 mg/dL (ref 70–99)
Glucose-Capillary: 88 mg/dL (ref 70–99)
Glucose-Capillary: 88 mg/dL (ref 70–99)
Glucose-Capillary: 88 mg/dL (ref 70–99)
Glucose-Capillary: 90 mg/dL (ref 70–99)

## 2019-07-21 LAB — BASIC METABOLIC PANEL
Anion gap: 9 (ref 5–15)
BUN: 12 mg/dL (ref 6–20)
CO2: 23 mmol/L (ref 22–32)
Calcium: 9 mg/dL (ref 8.9–10.3)
Chloride: 107 mmol/L (ref 98–111)
Creatinine, Ser: 0.7 mg/dL (ref 0.61–1.24)
GFR calc Af Amer: >60 mL/min (ref >60)
GFR calc non Af Amer: >60 mL/min (ref >60)
Glucose, Bld: 90 mg/dL (ref 70–99)
Potassium: 4.1 mmol/L (ref 3.5–5.1)
Sodium: 139 mmol/L (ref 135–145)

## 2019-07-21 LAB — TSH: TSH: 2.487 u[IU]/mL (ref 0.350–4.500)

## 2019-07-21 LAB — HEMOGLOBIN A1C
Hgb A1c MFr Bld: 5.3 % (ref 4.8–5.6)
Mean Plasma Glucose: 105.41 mg/dL

## 2019-07-21 LAB — LIPID PANEL
Cholesterol: 99 mg/dL (ref 0–200)
HDL: 38 mg/dL — ABNORMAL LOW (ref >40)
LDL Cholesterol: 43 mg/dL (ref 0–99)
Total CHOL/HDL Ratio: 2.6 RATIO
Triglycerides: 88 mg/dL (ref <150)
VLDL: 18 mg/dL (ref 0–40)

## 2019-07-21 LAB — CBC WITH DIFFERENTIAL/PLATELET
Abs Immature Granulocytes: 0.09 10*3/uL — ABNORMAL HIGH (ref 0.00–0.07)
Basophils Absolute: 0 10*3/uL (ref 0.0–0.1)
Basophils Relative: 0 %
Eosinophils Absolute: 0 10*3/uL (ref 0.0–0.5)
Eosinophils Relative: 0 %
HCT: 35.6 % — ABNORMAL LOW (ref 39.0–52.0)
Hemoglobin: 11.9 g/dL — ABNORMAL LOW (ref 13.0–17.0)
Immature Granulocytes: 1 %
Lymphocytes Relative: 25 %
Lymphs Abs: 3.9 10*3/uL (ref 0.7–4.0)
MCH: 30.7 pg (ref 26.0–34.0)
MCHC: 33.4 g/dL (ref 30.0–36.0)
MCV: 91.8 fL (ref 80.0–100.0)
Monocytes Absolute: 1.7 10*3/uL — ABNORMAL HIGH (ref 0.1–1.0)
Monocytes Relative: 11 %
Neutro Abs: 9.5 10*3/uL — ABNORMAL HIGH (ref 1.7–7.7)
Neutrophils Relative %: 63 %
Platelets: 191 10*3/uL (ref 150–400)
RBC: 3.88 MIL/uL — ABNORMAL LOW (ref 4.22–5.81)
RDW: 13 % (ref 11.5–15.5)
WBC: 15.2 10*3/uL — ABNORMAL HIGH (ref 4.0–10.5)
nRBC: 0 % (ref 0.0–0.2)

## 2019-07-21 LAB — LACTATE DEHYDROGENASE: LDH: 104 U/L (ref 98–192)

## 2019-07-21 LAB — D-DIMER, QUANTITATIVE: D-Dimer, Quant: 3.14 ug/mL-FEU — ABNORMAL HIGH (ref 0.00–0.50)

## 2019-07-21 LAB — FERRITIN: Ferritin: 102 ng/mL (ref 24–336)

## 2019-07-21 LAB — HIV ANTIBODY (ROUTINE TESTING W REFLEX): HIV Screen 4th Generation wRfx: NONREACTIVE — AB

## 2019-07-21 LAB — VITAMIN B12: Vitamin B-12: 297 pg/mL (ref 180–914)

## 2019-07-21 LAB — C-REACTIVE PROTEIN: CRP: <0.8 mg/dL (ref <1.0)

## 2019-07-21 IMAGING — MR MR MRA HEAD W/O CM
10 of 12 series · 31 of 48 positions shown · non-contrast
Comparison: CT angiogram head/neck [DATE], noncontrast head CT
[DATE]

CLINICAL DATA: Stroke, follow-up.

EXAM:
MRI HEAD WITHOUT CONTRAST
MRA HEAD WITHOUT CONTRAST
TECHNIQUE: Multiplanar, multiecho pulse sequences of the brain and surrounding
structures were obtained without intravenous contrast. Angiographic
images of the head were obtained using MRA technique without
contrast.

[Series 3: DWI · axial · 3.0mm · 1.09mm/px · z∈[-41,+109]mm · 6 of 102 slices shown (1 of 4)]
[im 1/102]
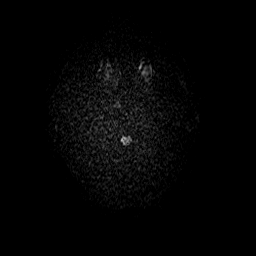
[im 21/102]
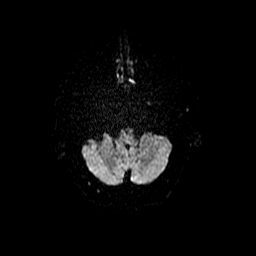
[im 41/102]
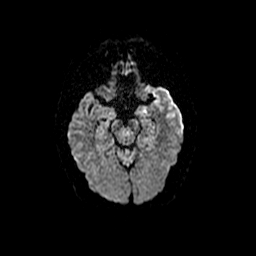
[im 61/102]
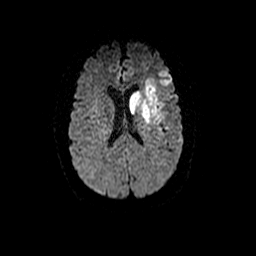
[im 81/102]
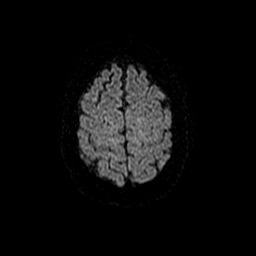
[im 102/102]
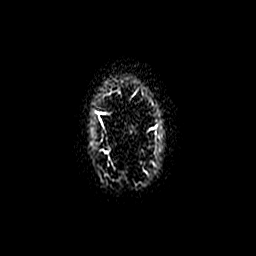

[Series 5: DWI · coronal · 5.0mm · 1.09mm/px · 5 of 70 slices shown (2 of 4)]
[im 1/70]
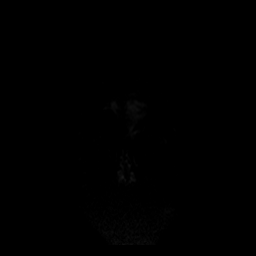
[im 18/70]
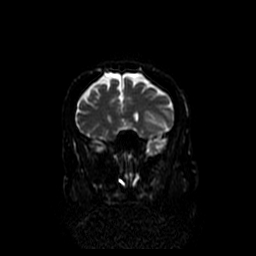
[im 35/70]
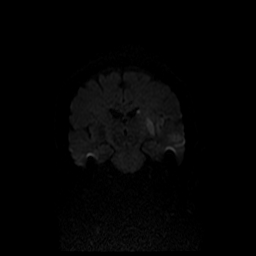
[im 52/70]
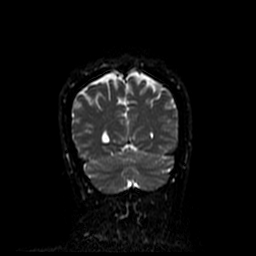
[im 70/70]
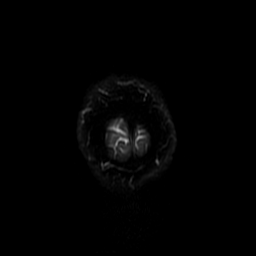

[Series 6: T1 · sagittal · 5.0mm · 0.47mm/px · 2 of 23 slices shown (1 of 2)]
[im 1/23]
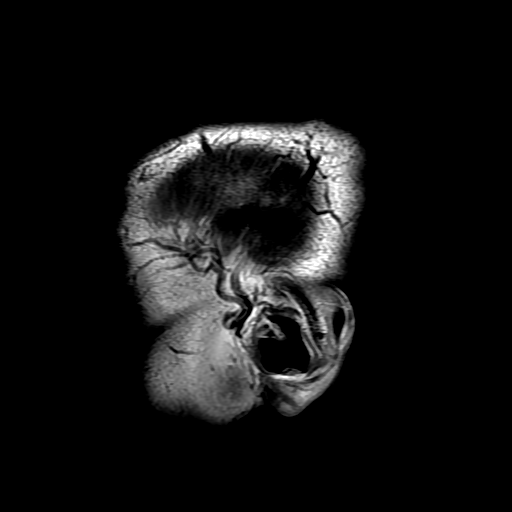
[im 23/23]
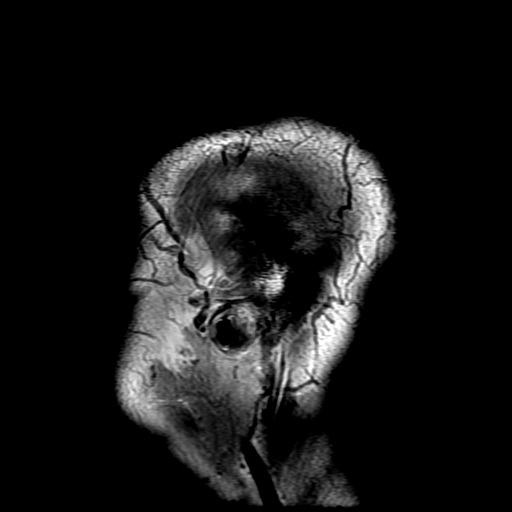

[Series 7: T1 · axial · 3.0mm · 0.47mm/px · z∈[-47,-2]mm · 3 of 108 slices shown (2 of 2)]
[im 1/108]
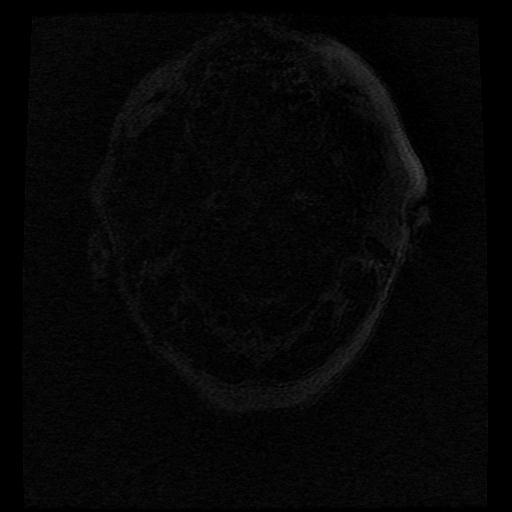
[im 16/108]
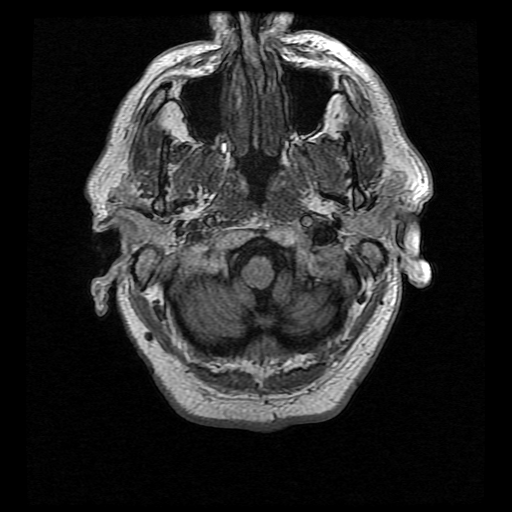
[im 31/108]
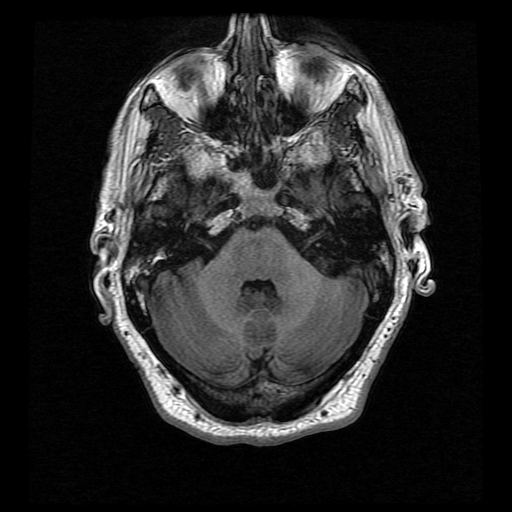

[Series 8: T2 · axial · 5.0mm · 0.43mm/px · z∈[-33,+116]mm · 2 of 26 slices shown (1 of 3)]
[im 1/26]
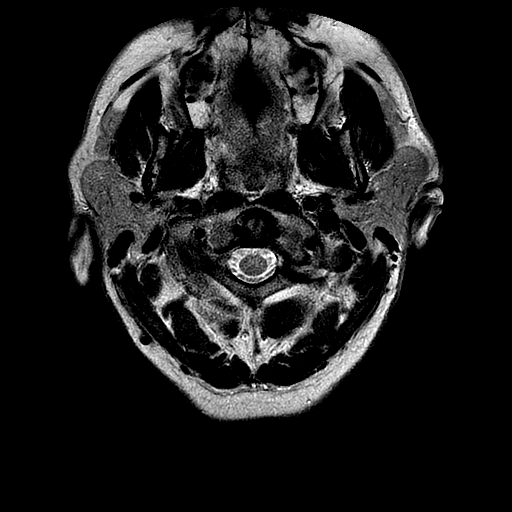
[im 26/26]
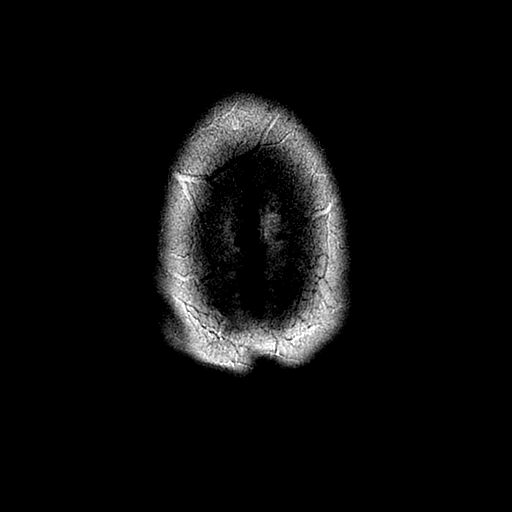

[Series 9: FLAIR · axial · 5.0mm · 0.43mm/px · z∈[-34,+116]mm · 2 of 26 slices shown]
[im 1/26]
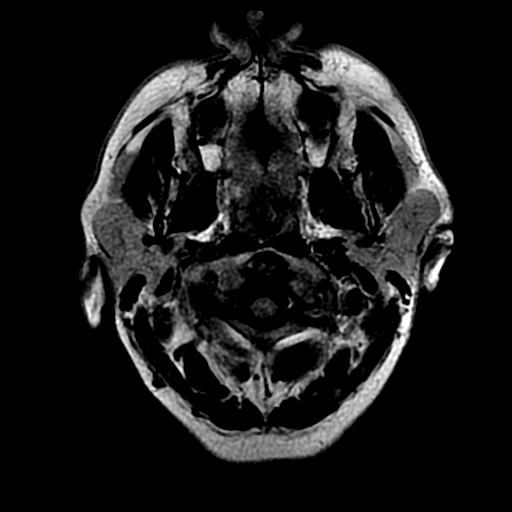
[im 26/26]
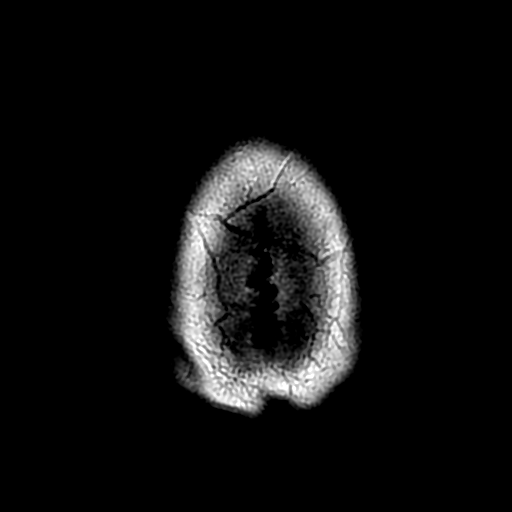

[Series 11: T2 · coronal · 5.0mm · 0.43mm/px · 2 of 30 slices shown (2 of 3)]
[im 1/30]
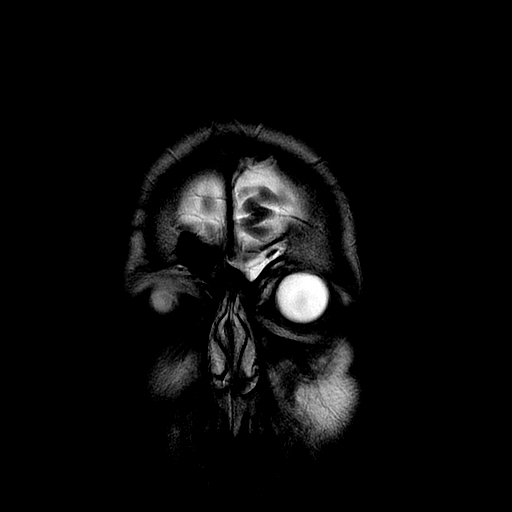
[im 30/30]
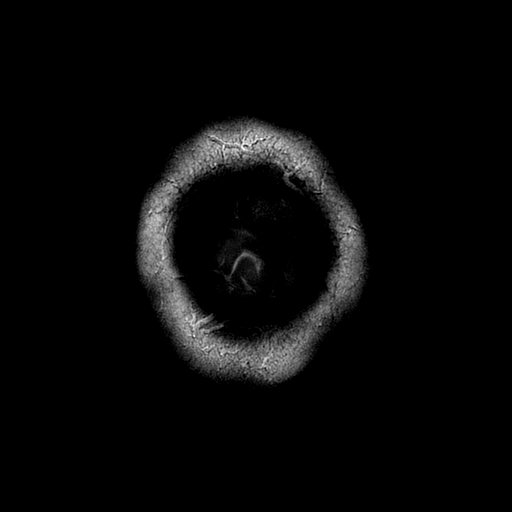

[Series 12: T2 · axial · 5.0mm · 0.43mm/px · z∈[-33,+116]mm · 2 of 26 slices shown (3 of 3)]
[im 1/26]
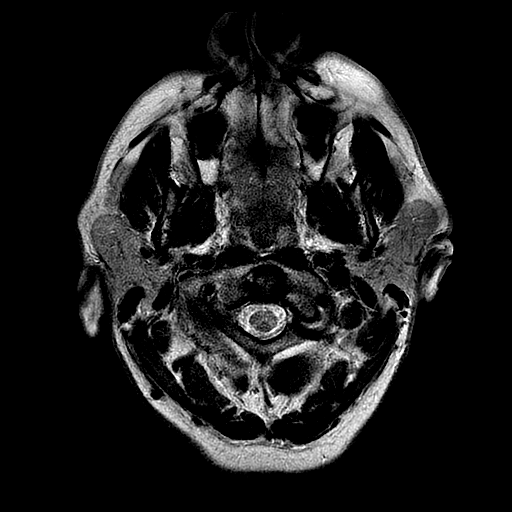
[im 26/26]
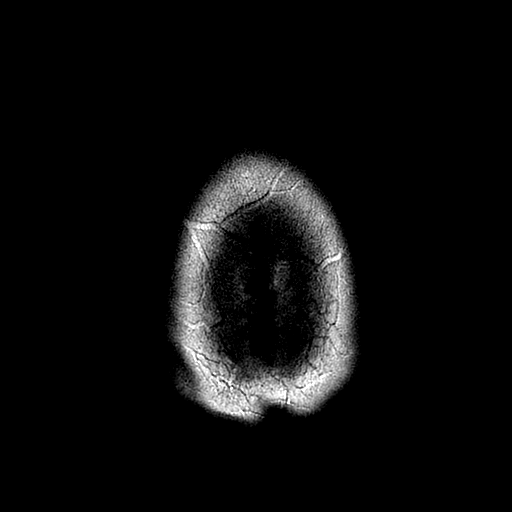

[Series 300: DWI · axial · 3.0mm · 1.09mm/px · z∈[-41,+109]mm · 4 of 51 slices shown (3 of 4)]
[im 1/51]
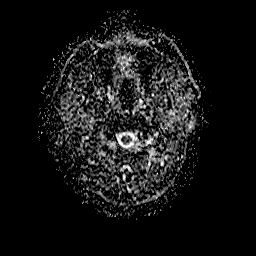
[im 17/51]
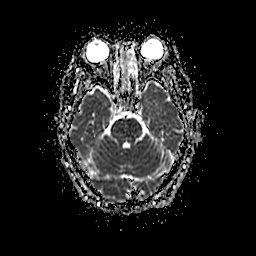
[im 34/51]
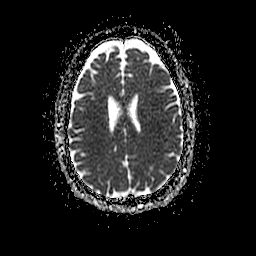
[im 51/51]
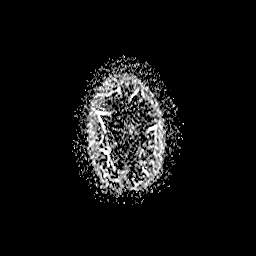

[Series 500: DWI · coronal · 5.0mm · 1.09mm/px · 3 of 35 slices shown (4 of 4)]
[im 1/35]
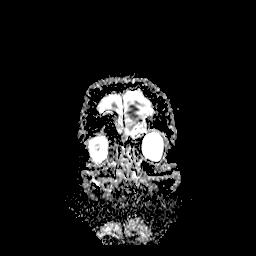
[im 18/35]
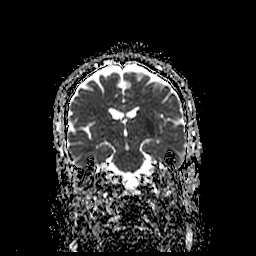
[im 35/35]
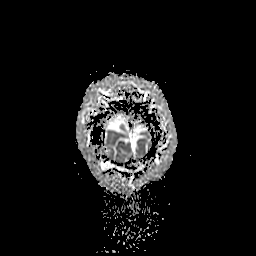

[31 of 48 positions shown; findings below may reference images not displayed]

FINDINGS: MRI HEAD FINDINGS

Brain:

There is a moderate to large region of abnormal restricted diffusion
consistent with acute/early subacute infarct within the left MCA
vascular territory, involving the left frontal and temporal lobe
cortex, left insula and subinsular region as well as left basal
ganglia. Corresponding T2/FLAIR hyperintensity at this site.
Regional mass effect with mild effacement of the left lateral
ventricle. 1-2 mm midline shift. Evidence of intracranial
hemorrhage.

Vascular: Reported separate

Skull and upper cervical spine: No focal marrow lesion

Sinuses/Orbits: Visualized orbits demonstrate no acute abnormality.
Redemonstrated chronic sinusitis with mucosal thickening greatest
within the inferior left frontal sinus and bilateral ethmoid air
cells.

MRA HEAD FINDINGS

The intracranial internal carotid arteries are patent without
significant stenosis. There has been interval recanalization of the
left carotid artery terminus as well as proximal left anterior and
middle cerebral arteries.

The intracranial vertebral arteries are patent without significant
stenosis, as is the basilar artery. The bilateral posterior cerebral
arteries are patent without significant proximal stenosis.

No intracranial aneurysm is identified.

These results were called by telephone at the time of interpretation
on [DATE] at [DATE] to provider [email or web address] , who verbally
acknowledged these results.
IMPRESSION: Brain MRI:

1. Moderate to large acute/early subacute infarct involving the left
frontal and temporal lobes, left insula and subinsular region and
left basal ganglia.
2. Regional mass effect with mild effacement of the left lateral
ventricle. 1-2 mm midline shift.
3. Paranasal sinus disease as described.

MRA head:

No intracranial large vessel occlusion or proximal high-grade
arterial stenosis. Interval recanalization of the left carotid
artery terminus as well as proximal left anterior and middle
cerebral arteries.

## 2019-07-21 MED ORDER — ASPIRIN EC 81 MG PO TBEC
81.0000 mg | DELAYED_RELEASE_TABLET | Freq: Every day | ORAL | Status: DC
  Administered 2019-07-21 – 2019-07-22 (×2): 81 mg via ORAL
  Filled 2019-07-21 (×2): qty 1

## 2019-07-21 MED ORDER — LOSARTAN POTASSIUM 25 MG PO TABS
25.0000 mg | ORAL_TABLET | Freq: Every day | ORAL | Status: DC
  Administered 2019-07-22: 25 mg via ORAL
  Filled 2019-07-21 (×2): qty 1

## 2019-07-21 MED ORDER — ENOXAPARIN SODIUM 40 MG/0.4ML ~~LOC~~ SOLN
40.0000 mg | SUBCUTANEOUS | Status: DC
  Administered 2019-07-21: 40 mg via SUBCUTANEOUS
  Filled 2019-07-21: qty 0.4

## 2019-07-21 MED ORDER — PHENYLEPHRINE HCL-NACL 10-0.9 MG/250ML-% IV SOLN
0.0000 ug/min | INTRAVENOUS | Status: DC
  Administered 2019-07-21: 20 ug/min via INTRAVENOUS
  Filled 2019-07-21: qty 250

## 2019-07-21 MED ORDER — CLOPIDOGREL BISULFATE 75 MG PO TABS
75.0000 mg | ORAL_TABLET | Freq: Every day | ORAL | Status: DC
  Administered 2019-07-21 – 2019-07-22 (×2): 75 mg via ORAL
  Filled 2019-07-21 (×2): qty 1

## 2019-07-21 MED ORDER — PANTOPRAZOLE SODIUM 40 MG PO TBEC
40.0000 mg | DELAYED_RELEASE_TABLET | Freq: Every day | ORAL | Status: DC
  Administered 2019-07-21 – 2019-07-22 (×2): 40 mg via ORAL
  Filled 2019-07-21 (×2): qty 1

## 2019-07-21 MED ORDER — GLUCOSE 4 G PO CHEW
CHEWABLE_TABLET | ORAL | Status: AC
  Administered 2019-07-21: 1
  Filled 2019-07-21: qty 1

## 2019-07-21 NOTE — Progress Notes (Signed)
eLink Physician-Brief Progress Note Patient Name: Matthew Stanton DOB: September 06, 1970 MRN: [id number]   Date of Service  07/21/2019  HPI/Events of Note  Pt is s/p left MCA CVA  With endovascular revascularization, his blood pressure is below guidelines given by neurology.  eICU Interventions  Phenylephrine infusion started to keep MAP > 65 and SBP > 120        Chrisie Jankovich U Darlin Stenseth 07/21/2019, 2:19 AM

## 2019-07-21 NOTE — Consult Note (Signed)
NAME:  Matthew Stanton, MRN:  [id number], DOB:  19-Sep-1970, LOS: 1 ADMISSION DATE:  07/20/2019, CONSULTATION DATE:  11/18 REFERRING MD:  Dr. Cheral Marker, CHIEF COMPLAINT:  CVA-COVID   Brief History   48 year old male with recent positive COVID-19 test admitted 11/18 with CVA  History of present illness   48 year old male with PMH as below, which is significant for OSA (unclear if on CPAP) who had a positive COVID test on 11/13. He woke up without complaints on the morning of 11/18 and was well until 0640 when he suddenly fell and became non-verbal. Right sided weakness was noted. He was taken to [facility] ED where a CT was concerning for L MCA ischemia. Neurology was contacted via tele service and tpa was recommended and administered. He was transferred to [facility] where CTA confirmed L MCA occlusion. He was taken for IR recanalization. Post-procedurally he was transferred to the ICU on vent. PCCM to assist with vent/medical management.    Past Medical History   has a past medical history of Lab test positive for detection of COVID-19 virus and OSA (obstructive sleep apnea).  Significant Hospital Events   11/18 admit, CVA, Neuro IR. Vent.   Consults:  Neurology PCCM IR  Procedures:  11/18 Neuro IR revascularization 11/18 ETT  Significant Diagnostic Tests:  CT head 11/18 > No acute intracranial hemorrhage or evidence of acute infarction. 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 CT A/P 11/18 > Thrombus at the left ICA terminus extending into A1 and M1 segments. There is reconstitution of the left A1 likely via patent anterior communicating artery. Reconstitution of more distal left MCA branches with diminished flow.  Micro Data:  COVID- positive  Antimicrobials:    Interim history/subjective:  Extubated yesterday afternoon  Low dose Neo gtt required briefly overnight to achieve neuro BP goals  Otherwise no sig change   Objective   Blood pressure  140/65, pulse 67, temperature 97.8 F (36.6 C), temperature source Oral, resp. rate 12, height 5\' 10"  (1.778 m), weight 85 kg, SpO2 100 %.    Vent Mode: CPAP;PSV FiO2 (%):  [40 %-100 %] 40 % Set Rate:  [14 bmp] 14 bmp Vt Set:  [580 mL] 580 mL PEEP:  [5 cmH20] 5 cmH20 Pressure Support:  [5 cmH20] 5 cmH20 Plateau Pressure:  [16 cmH20] 16 cmH20   Intake/Output Summary (Last 24 hours) at 07/21/2019 0854 Last data filed at 07/21/2019 0700 Gross per 24 hour  Intake 2132.79 ml  Output 2235 ml  Net -102.21 ml   Filed Weights   07/20/19 0803  Weight: 85 kg    Examination: General: wdwn male in NAD HENT: [place]/AT, PERRL, no JVD Lungs: resps even non labored on RA, clear  Cardiovascular: RRR, no MRG Abdomen: Soft, non-tender, non-distended Extremities: No acute deformity Neuro: awake alert, aphasia +/- mild confusion, MAE, follows most commands, gave thumbs up but could not "show me 2 fingers"   Resolved Hospital Problem list     Assessment & Plan:   Left acute MCA occlusion: s/p TPA and NIR revascularization 11/18. In the setting of COVID-19 infection PLAN -  Mgmt per stroke  MRI, echo pending  Goal SBP 120-140 - required pressors briefly to achieve this but now off  Will need heparin infusion when OK by IR team. At risk for other COVID related thromboses.   Post-operative vent dependence: he is not hypoxic on ABG. Unlikely he has COVID-19 pneumonia at this point. Resolved. Extubated  11/19 PLAN -  Mobilize  Pulmonary hygiene  F/u CXR    COVID-19 infection: does not have COVID pneumonia PLAN -  Trend D-Dimer, Ferritin, LDH, CRP, Vitamin D  no role for therapeutics at this time  will need heparin as above once OK by NIR  Monitor in ICU this am but can tx out if remains off pressors   Best practice:  Diet: NPO Pain/Anxiety/Delirium protocol (if indicated): n/a VAP protocol (if indicated): n/a DVT prophylaxis: s/p TPA GI prophylaxis: NA Glucose control: NA Mobility:  BR Code Status: FULL Family Communication: Per primary Disposition: ICU  Labs   CBC: Recent Labs  Lab 07/20/19 0750 07/20/19 1143 07/21/19 0355  WBC 10.3  --  15.2*  NEUTROABS 6.4  --  9.5*  HGB 12.8* 11.6* 11.9*  HCT 37.9* 34.0* 35.6*  MCV 91.3  --  91.8  PLT 208  --  99991111    Basic Metabolic Panel: Recent Labs  Lab 07/20/19 0750 07/20/19 1143 07/21/19 0355  NA 137 137 139  K 3.8 3.8 4.1  CL 107  --  107  CO2 23  --  23  GLUCOSE 103*  --  90  BUN 16  --  12  CREATININE 0.86  --  0.70  CALCIUM 8.6*  --  9.0   GFR: Estimated Creatinine Clearance: 117.9 mL/min (by C-G formula based on SCr of 0.7 mg/dL). Recent Labs  Lab 07/20/19 0750 07/21/19 0355  WBC 10.3 15.2*    Liver Function Tests: Recent Labs  Lab 07/20/19 0750  AST 14*  ALT 19  ALKPHOS 47  BILITOT 0.9  PROT 6.3*  ALBUMIN 3.6   No results for input(s): LIPASE, AMYLASE in the last 168 hours. No results for input(s): AMMONIA in the last 168 hours.  ABG    Component Value Date/Time   PHART 7.390 07/20/2019 1143   PCO2ART 42.3 07/20/2019 1143   PO2ART 481.0 (H) 07/20/2019 1143   HCO3 25.6 07/20/2019 1143   TCO2 27 07/20/2019 1143   O2SAT 100.0 07/20/2019 1143     Coagulation Profile: Recent Labs  Lab 07/20/19 0750  INR 1.1    Cardiac Enzymes: No results for input(s): CKTOTAL, CKMB, CKMBINDEX, TROPONINI in the last 168 hours.  HbA1C: Hgb A1c MFr Bld  Date/Time Value Ref Range Status  07/21/2019 03:55 AM 5.3 4.8 - 5.6 % Final    Comment:    (NOTE) Pre diabetes:          5.7%-6.4% Diabetes:              >6.4% Glycemic control for   <7.0% adults with diabetes     CBG: Recent Labs  Lab 07/20/19 1604 07/20/19 2004 07/21/19 0007 07/21/19 0335 07/21/19 0409  GLUCAP 97 93 88 63* 88      Critical care time: 30 mins     Nickolas Madrid, NP Pulmonary/Critical Care Medicine  07/21/2019  8:54 AM

## 2019-07-21 NOTE — Progress Notes (Signed)
Per CCM notes BP should be kept between 0000000 (diastolic). Current BP is 117/56. RN called Warren Lacy to verify/place orders if needed. Dr. Lucile Shutters stated to notify MD if BP drops to [id number] diastolic or below. Pt in NAD, only deficits are aphasia, VSS. Will continue to monitor closely. Clint Bolder, RN 07/21/19 12:41 AM

## 2019-07-21 NOTE — Progress Notes (Signed)
Hypoglycemic Event  CBG: 63  Treatment: 4 GM chewable glucose  Tablet  Symptoms: None  Follow-up CBG: [id number] CBG Result:88  Possible Reasons for Event: Inadequate meal intake      Clint Bolder

## 2019-07-21 NOTE — Progress Notes (Signed)
SLP Cancellation Note  Patient Details Name: Matthew Stanton MRN: [id number] DOB: [birth year]   Cancelled treatment:       Reason Eval/Treat Not Completed: Patient at procedure or test/unavailable - at MRI. Will f/u for SLE as able. Of note, pt passed the [facility] swallow screen. He was intubated for <24 hours and RN reports no difficulty with swallowing on current diet. Will defer swallow evaluation at this time.   Venita Sheffield Emalene Welte 07/21/2019, 10:07 AM  Pollyann Glen, M.A. [facility] Environmental education officer [phone] Office [phone]

## 2019-07-21 NOTE — Consult Note (Addendum)
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                                                             +---------+---------------+---------+-----------+----------+--------------+     Summary: Right: There is no evidence of deep vein thrombosis in the lower extremity. No cystic structure found in the popliteal fossa. Left: There is no evidence of deep vein thrombosis in the lower extremity. No cystic structure found in the popliteal fossa.  *See table(s) above for measurements and observations.  Preliminary     Assessment and Plan:   Cardiomyopathy -Patient with Covid infection admitted for stroke and found to have decreased EF of 45% on echocardiogram with global LV hypokinesis and mildly reduced RV systolic function.  No prior cardiac history documented.  Per notes, patient's wife reported that patient had " hole in heart" since age 81-4. -CVD risk factors include remote prior smoker. Lipid levels are optimal. A1c is 5.3.  -No apparent volume overload.  -HR too  low to add beta blocker at this time. Will add low dose ARB, losartan 25 mg to aid with remodeling. -In the setting of stroke we often see cardiac weakness which usually improves over time.  -Continue to be watchful and we will plan for follow up after discharge.   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  Hypercoagulable and autoimmune labs per neurology. -Patient is now on aspirin 81 mg and Plavix for dual antiplatelet therapy  Possible ASD versus PFO -Per notes, patient's wife reported that patient had " hole in heart" since age 81-4. -TCD bubble study pending tomorrow -If abnormality noted on TCD and pt felt to need TEE, would wait until he is fully over [place].   Covid infection -Positive Covid testing on 07/15/2019 and again on 07/20/2019. -Patient does not have Covid pneumonia, per neurology  Obstructive sleep apnea -?  On CPAP at home   For questions or updates, please contact [place] Please consult [email or web address] for contact info under    Signed, Daune Perch, NP  07/21/2019 12:34 PM   Personally seen and examined. Agree with above.  Visit was performed with telemedicine device, video and audio secondary to COVID-19 pandemic in order to reduce exposure/PPE use  48 year old male with acute stroke, TPA, echocardiogram with ejection fraction demonstrating EF of 40 to 45% mild to moderately reduced.  We are asked to evaluate for reduction in EF.  He denies any chest pain, syncope, bleeding, shortness of breath.  Extubated yesterday.  Able to move all extremities.  Speech has improved.  Denies any prior cardiac history.  We asked him about the possible "hole in heart "since age 44 or 5 with no surgery and he was not aware of this.  GEN: Well nourished, well developed, in no acute distress  HEENT: normal  Neck: no obvious  JVD Cardiac: Telemetry reveals sinus rhythm with transient sinus bradycardia during night Respiratory: Normal respiratory effort, no increased work of breathing able to complete full sentences without difficulty  GI:  nondistended MS: no deformity or atrophy  Skin: no rash Neuro:  Alert and Oriented x 3, Strength and sensation are intact, demonstrated moving extremities without difficulty  Transcranial Doppler study is pending for tomorrow-this will elucidate potential PFO.  Assessment and plan:  Acute stroke -If transcranial Doppler is positive, a TEE would be helpful however we would need to do this when he is resolved from Covid.  -He is on both aspirin and Plavix currently. -No signs of atrial fibrillation on current telemetry. -We will go ahead and set him up on discharge for a ZIO 14-day monitor to further evaluate for possible atrial fibrillation.  Mildly reduced ejection fraction -We will start him on low-dose angiotensin receptor blocker losartan 25 mg once a day.  Heart rate has been quite slow at times so we will not add beta-blocker.  Does not have any evidence  of fluid overload at this time.  Hopefully over the next several weeks, his EF will improve post stroke.  We will set him up with a follow-up visit in our clinic in approximately 1 to 2 months.  Please let us know if we can be of further assistance.  We will sign off.   Candee Furbish, MD

## 2019-07-21 NOTE — Progress Notes (Signed)
OT Cancellation Note  Patient Details Name: FLEM EBRAHIM MRN: [id number] DOB: 1971-03-17   Cancelled Treatment:    Reason Eval/Treat Not Completed: Patient at procedure or test/unavailable.  Pt at MRI.  OT will check back later as schedule allows. Pt also currently on strict bed rest, will follow up as he also has OOB activity orders. Merri Ray Harjit Leider 07/21/2019, 3:51 PM

## 2019-07-21 NOTE — Progress Notes (Signed)
Lower extremity venous has been completed.   Preliminary results in CV Proc.   Abram Sander 07/21/2019 11:03 AM

## 2019-07-21 NOTE — Progress Notes (Signed)
STROKE TEAM PROGRESS NOTE   INTERVAL HISTORY Pt lying in bed, awake alert and following all commands. Still has partial expressive aphasia and anomia. Moving all extremities. Passed swallow. On diet. Pending MRI/MRA. EF 45% with LV and RV decreased function. May need cardiology consult.   Vitals:   07/21/19 0815 07/21/19 0830 07/21/19 0845 07/21/19 0900  BP: 136/67 125/70 128/65 131/66  Pulse: (!) 56 (!) 56 (!) 58 (!) 58  Resp: (!) 9 (!) 9 10 11   Temp:      TempSrc:      SpO2: 100% 99% 100% 100%  Weight:      Height:        CBC:  Recent Labs  Lab 07/20/19 0750 07/20/19 1143 07/21/19 0355  WBC 10.3  --  15.2*  NEUTROABS 6.4  --  9.5*  HGB 12.8* 11.6* 11.9*  HCT 37.9* 34.0* 35.6*  MCV 91.3  --  91.8  PLT 208  --  99991111    Basic Metabolic Panel:  Recent Labs  Lab 07/20/19 0750 07/20/19 1143 07/21/19 0355  NA 137 137 139  K 3.8 3.8 4.1  CL 107  --  107  CO2 23  --  23  GLUCOSE 103*  --  90  BUN 16  --  12  CREATININE 0.86  --  0.70  CALCIUM 8.6*  --  9.0   Lipid Panel:     Component Value Date/Time   CHOL 99 07/21/2019 0355   TRIG 88 07/21/2019 0355   HDL 38 (L) 07/21/2019 0355   CHOLHDL 2.6 07/21/2019 0355   VLDL 18 07/21/2019 0355   LDLCALC 43 07/21/2019 0355   HgbA1c:  Lab Results  Component Value Date   HGBA1C 5.3 07/21/2019   Urine Drug Screen:     Component Value Date/Time   LABOPIA NONE DETECTED 07/20/2019 1300   COCAINSCRNUR NONE DETECTED 07/20/2019 1300   LABBENZ POSITIVE (A) 07/20/2019 1300   AMPHETMU NONE DETECTED 07/20/2019 1300   THCU NONE DETECTED 07/20/2019 1300   LABBARB NONE DETECTED 07/20/2019 1300    Alcohol Level     Component Value Date/Time   ETH <10 07/20/2019 0750    IMAGING  Mr Brain Wo Contrast 07/21/2019 1. Moderate to large acute/early subacute infarct involving the left frontal and temporal lobes, left insula and subinsular region and left basal ganglia. 2. Regional mass effect with mild effacement of the left  lateral ventricle. 1-2 mm midline shift. 3. Paranasal sinus disease as described.   Mr Jodene Nam Head Wo Contrast 07/21/2019  No intracranial large vessel occlusion or proximal high-grade arterial stenosis. Interval recanalization of the left carotid artery terminus as well as proximal left anterior and middle cerebral arteries.  Cerebral Angiogram 07/20/2019 S/P Lt common carotid arteriogram followed by complete revascularization of Lt ICA terminus and Lt MCA occlusion with x 1 pass with solitaire 11mmx 38mm x retriever device and penumbra suction achieving a TICI 3 revascularization  Ct Angio Head W Or Wo Contrast Ct Angio Neck W And/or Wo Contrast 07/20/2019 Thrombus at the left ICA terminus extending into A1 and M1 segments. There is reconstitution of the left A1 likely via patent anterior communicating artery. Reconstitution of more distal left MCA branches with diminished flow. No measurable stenosis in the neck.   Ct Head Code Stroke Wo Contrast 07/20/2019 1. No acute intracranial hemorrhage or evidence of acute infarction. 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 2. ASPECTS is 10   2D Echocardiogram  1. Left ventricular  ejection fraction, by visual estimation, is 45%. The left ventricle has mildly decreased function. There is no left ventricular hypertrophy.  2. The left ventricle demonstrates global hypokinesis.  3. Left ventricular diastolic parameters are indeterminate.  4. Global right ventricle has mildly reduced systolic function.The right ventricular size is normal. No increase in right ventricular wall thickness.  5. Left atrial size was normal.  6. Right atrial size was normal.  7. The mitral valve is normal in structure. No evidence of mitral valve regurgitation. No evidence of mitral stenosis.  8. The aortic valve is tricuspid. Aortic valve regurgitation is not visualized. No evidence of aortic valve sclerosis or stenosis.  9. The inferior vena cava  is dilated in size with <50% respiratory variability, suggesting right atrial pressure of 15 mmHg. 10. The tricuspid valve is normal in structure. Tricuspid valve regurgitation is not demonstrated. 11. TR signal is inadequate for assessing pulmonary artery systolic pressure.  LE Doppler 07/21/2019 Right: There is no evidence of deep vein thrombosis in the lower extremity. No cystic structure found in the popliteal fossa. Left: There is no evidence of deep vein thrombosis in the lower extremity. No cystic structure found in the popliteal fossa.  Dg Chest Port 1 View 07/20/2019 Endotracheal tube as described.  No acute abnormality noted.    PHYSICAL EXAM  Temp:  [97.7 F (36.5 C)-98.7 F (37.1 C)] 97.8 F (36.6 C) (11/19 0400) Pulse Rate:  [44-110] 58 (11/19 0900) Resp:  [7-19] 11 (11/19 0900) BP: (108-147)/(54-94) 131/66 (11/19 0900) SpO2:  [97 %-100 %] 100 % (11/19 0900) Arterial Line BP: (70-148)/(49-76) 95/67 (11/19 0900) FiO2 (%):  [40 %] 40 % (11/18 1553)  General - Well nourished, well developed, in no apparent distress.  Ophthalmologic - fundi not visualized due to noncooperation.  Cardiovascular - Regular rhythm and rate.  Mental Status -  Level of arousal and orientation to time, place, and person were intact. Language including repetition, comprehension was assessed and found intact. Partial expressive aphasia and anomia. Able to name 3/4 objects, hesitancy of speech.   Cranial Nerves II - XII - II - Visual field intact OU. III, IV, VI - Extraocular movements intact. V - Facial sensation intact bilaterally. VII - mild right facial droop. VIII - Hearing & vestibular intact bilaterally. X - Palate elevates symmetrically. XI - Chin turning & shoulder shrug intact bilaterally. XII - Tongue protrusion intact.  Motor Strength - The patient's strength was normal in all extremities and pronator drift was absent.  Bulk was normal and fasciculations were absent.   Motor  Tone - Muscle tone was assessed at the neck and appendages and was normal.  Reflexes - The patient's reflexes were symmetrical in all extremities and he had no pathological reflexes.  Sensory - Light touch, temperature/pinprick were assessed and were symmetrical.    Coordination - The patient had normal movements in the hands and feet with no ataxia or dysmetria.  Tremor was absent.  Gait and Station - deferred.   ASSESSMENT/PLAN Mr. MOUHAMADOU PROFFER is a 48 y.o. male with history of OSA and positive COVID test 07/15/2019 presenting to [facility] with R sided weakness and aphasia. Received IV tPA 11/21/04/2019 at [facility].  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 increased density L MCA.  ASPECTS 10.     CTA head L ICA terminus thrombus extending into  L A1 and L M1, reconstitution at L A1 via ACom. L MCA w/ diminished flow  CTA neck unremarkable   Cerebral angio L ICA terminus and L MCA occlusions w/ TICI3 revascularization   Post IR CT no ICH or mass effect  MRI  Moderate to large acute L frontal lobe and temporal lobes, L insular and subinsular and L basal ganglia infarcts.   MRA  Interval recanalization L ICA terminus and proximal L ACA and L MCA.  2D Echo EF 45%. No source of embolus   LE doppler neg DVT  TCD bubble study pending tomorrow  LDL 43  HgbA1c 5.3  Hypercoagulable and autoimmune labs pending  SCDs for VTE prophylaxis  No antithrombotic prior to admission, now on ASA 81 and plavix DAPT.   Therapy recommendations:  pending   Disposition:  pending   COVID infection  07/14/2019 pt lost smell and taste  Initial test 07/15/2019 positive; repeat test 07/20/2019 positive  Does not have COVID PNA  No role for therapeutics per CCM  Will need COVID appropriate heparin doses once okd by neuro/IR  Possible ASD vs. PFO  As per  wife, pt had "hole in heart" since age 25-4  No surgery done  TCD bubble study pending tomorrow.  Cardiomyopathy  2D Echo EF 45%. No source of embolus   ? Related to long term ASD  Cardiology consulted  Other Stroke Risk Factors  Former Cigarette smoker, quit 21 yrs ago  Hx ETOH use  Obstructive sleep apnea, on CPAP at home  Other Active Problems  Acute blood loss anemia s/p IR 12.8-11.6-11.9  Leukocytosis WBC 10.3->15.2  Hospital day # 1  This patient is critically ill due to large left MCA infarct s/p MT, cardiomyopathy, COVID infection and at significant risk of neurological worsening, death form recurrent stroke, hemorrhagic conversion, heart failure, COVID complications. This patient's care requires constant monitoring of vital signs, hemodynamics, respiratory and cardiac monitoring, review of multiple databases, neurological assessment, discussion with family, other specialists and medical decision making of high complexity. I spent 40 minutes of neurocritical care time in the care of this patient. I discussed with Dr. Loanne Drilling from [facility].  I had long discussion with wife Misty over the phone, updated pt current condition, treatment plan and potential prognosis, and answered all the questions. She expressed understanding and appreciation.   Rosalin Hawking, MD PhD Stroke Neurology 07/21/2019 12:29 PM    To contact Stroke Continuity provider, please refer to [email or web address] After hours, contact General Neurology

## 2019-07-21 NOTE — Progress Notes (Signed)
PT Cancellation Note  Patient Details Name: Matthew Stanton MRN: [id number] DOB: [birth year]   Cancelled Treatment:    Reason Eval/Treat Not Completed: Patient at procedure or test/unavailable.  Pt at MRI.  PT will check back later.  Thanks,  Verdene Lennert, PT, DPT  Acute Rehabilitation [phone] pager [phone] office  @ [facility], [place]: [phone]     Harvie Heck 07/21/2019, 10:14 AM

## 2019-07-21 NOTE — Progress Notes (Signed)
Assisted with tele conference with wife and children.

## 2019-07-21 NOTE — Progress Notes (Signed)
Assisted tele visit to patient with wife.  Matthew Stanton, Philis Nettle, RN
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
## 2019-07-22 ENCOUNTER — Inpatient Hospital Stay (HOSPITAL_COMMUNITY): Payer: 59

## 2019-07-22 DIAGNOSIS — I63312 Cerebral infarction due to thrombosis of left middle cerebral artery: Secondary | ICD-10-CM

## 2019-07-22 LAB — CBC
HCT: 35.5 % — ABNORMAL LOW (ref 39.0–52.0)
Hemoglobin: 12.4 g/dL — ABNORMAL LOW (ref 13.0–17.0)
MCH: 30.8 pg (ref 26.0–34.0)
MCHC: 34.9 g/dL (ref 30.0–36.0)
MCV: 88.1 fL (ref 80.0–100.0)
Platelets: 194 10*3/uL (ref 150–400)
RBC: 4.03 MIL/uL — ABNORMAL LOW (ref 4.22–5.81)
RDW: 12.6 % (ref 11.5–15.5)
WBC: 11.6 10*3/uL — ABNORMAL HIGH (ref 4.0–10.5)
nRBC: 0 % (ref 0.0–0.2)

## 2019-07-22 LAB — CARDIOLIPIN ANTIBODIES, IGG, IGM, IGA
Anticardiolipin IgA: <9 APL U/mL (ref 0–11)
Anticardiolipin IgG: <9 GPL U/mL (ref 0–14)
Anticardiolipin IgM: <9 MPL U/mL (ref 0–12)

## 2019-07-22 LAB — HOMOCYSTEINE: Homocysteine: 13.2 umol/L (ref 0.0–14.5)

## 2019-07-22 LAB — BASIC METABOLIC PANEL
Anion gap: 10 (ref 5–15)
BUN: 12 mg/dL (ref 6–20)
CO2: 25 mmol/L (ref 22–32)
Calcium: 9 mg/dL (ref 8.9–10.3)
Chloride: 107 mmol/L (ref 98–111)
Creatinine, Ser: 0.75 mg/dL (ref 0.61–1.24)
GFR calc Af Amer: >60 mL/min (ref >60)
GFR calc non Af Amer: >60 mL/min (ref >60)
Glucose, Bld: 104 mg/dL — ABNORMAL HIGH (ref 70–99)
Potassium: 3.8 mmol/L (ref 3.5–5.1)
Sodium: 142 mmol/L (ref 135–145)

## 2019-07-22 LAB — BETA-2-GLYCOPROTEIN I ABS, IGG/M/A
Beta-2 Glyco I IgG: <9 GPI IgG units (ref 0–20)
Beta-2-Glycoprotein I IgA: <9 GPI IgA units (ref 0–25)
Beta-2-Glycoprotein I IgM: <9 GPI IgM units (ref 0–32)

## 2019-07-22 LAB — FERRITIN: Ferritin: 112 ng/mL (ref 24–336)

## 2019-07-22 LAB — GLUCOSE, CAPILLARY
Glucose-Capillary: 105 mg/dL — ABNORMAL HIGH (ref 70–99)
Glucose-Capillary: 102 mg/dL — ABNORMAL HIGH (ref 70–99)

## 2019-07-22 LAB — ANTI-DNA ANTIBODY, DOUBLE-STRANDED: ds DNA Ab: <1 IU/mL (ref 0–9)

## 2019-07-22 LAB — D-DIMER, QUANTITATIVE: D-Dimer, Quant: 2.48 ug/mL-FEU — ABNORMAL HIGH (ref 0.00–0.50)

## 2019-07-22 LAB — LACTATE DEHYDROGENASE: LDH: 99 U/L (ref 98–192)

## 2019-07-22 LAB — C-REACTIVE PROTEIN: CRP: 1 mg/dL — ABNORMAL HIGH (ref <1.0)

## 2019-07-22 MED ORDER — ASPIRIN 81 MG PO TBEC: 81.0000 mg | DELAYED_RELEASE_TABLET | Freq: Every day | ORAL | 0 refills | Status: AC

## 2019-07-22 MED ORDER — ATORVASTATIN CALCIUM 40 MG PO TABS: 40.0000 mg | ORAL_TABLET | Freq: Every day | ORAL | 3 refills | Status: DC

## 2019-07-22 MED ORDER — PANTOPRAZOLE SODIUM 40 MG PO TBEC: 40.0000 mg | DELAYED_RELEASE_TABLET | Freq: Every day | ORAL | 0 refills | Status: DC

## 2019-07-22 MED ORDER — LOSARTAN POTASSIUM 25 MG PO TABS: 25.0000 mg | ORAL_TABLET | Freq: Every day | ORAL | 0 refills | Status: DC

## 2019-07-22 MED ORDER — CLOPIDOGREL BISULFATE 75 MG PO TABS: 75.0000 mg | ORAL_TABLET | Freq: Every day | ORAL | 0 refills | Status: DC

## 2019-07-22 NOTE — TOC Initial Note (Addendum)
Transition of Care [facility]) - Initial/Assessment Note    Patient Details  Name: Matthew Stanton MRN: [id number] Date of Birth: [birth year]  [facility] CM/SW Contact:    Maryclare Labrador, RN Phone Number: 07/22/2019, 9:20 AM  Clinical Narrative:    PTA independent from home with wife.  Per notes;  Pt may discharge over the weekend - CM made attending aware that referral for outpt PT would be arranged.  Per bedside nurse pt is experiencing fluctuating orientation this am.  CM spoke with pts wife - wife informed CM that pt has participated at [facility] facility post shoulder surgery - wife chose this address for pt to get recommended outpt at discharge.     Update:  Pt to discharge home today.  CM requested outpt Speech therapy consideration/order from attending based on cone therapy recommendation.    CM received verbal order for referral for outpt speech.  Referrals made to Neuro rehab - wife choice now that speech is required.              Expected Discharge Plan: Home/Self Care(outpt therapy)     Patient Goals and CMS Choice Patient states their goals for this hospitalization and ongoing recovery are:: Pt is not oriented this am - CM contacted wife   Choice offered to / list presented to : Spouse  Expected Discharge Plan and Services Expected Discharge Plan: Home/Self Care(outpt therapy)     Post Acute Care Choice: (outpt PT as recommended) Living arrangements for the past 2 months: Single Family Home                                      Prior Living Arrangements/Services Living arrangements for the past 2 months: Single Family Home Lives with:: Spouse Patient language and need for interpreter reviewed:: Yes        Need for Family Participation in Patient Care: Yes (Comment) Care giver support system in place?: Yes (comment)   Criminal Activity/Legal Involvement Pertinent to Current Situation/Hospitalization: No - Comment as  needed  Activities of Daily Living      Permission Sought/Granted   Permission granted to share information with : Yes, Verbal Permission Granted(verbal permission given by wife Radio broadcast assistant)     Permission granted to share info w AGENCY: [place] [facility] [employer] [facility]       Orientation: : Fluctuating Orientation (Suspected and/or reported [place])      Admission diagnosis:  Stroke [facility]) [I63.9] Cerebrovascular accident (CVA), unspecified mechanism ([place]) [I63.9] Patient Active Problem List   Diagnosis Date Noted  . Stroke (cerebrum) ([place]) 07/20/2019  . 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 History of ETT   . COVID-19 virus detected   . Plantar fasciitis 04/02/2018  . Family history of colon cancer 03/30/2015  . Transient anemia 03/30/2015  . Obstructive sleep apnea 02/28/2008   PCP:  [name], MD Pharmacy:   [place] [place] - [place] [address] [place] [address] [place] [address] Phone: [phone] Fax: [phone]     Social Determinants of Health (SDOH) Interventions    Readmission Risk Interventions No flowsheet data found.

## 2019-07-22 NOTE — Progress Notes (Signed)
Discharge teaching was given to both pt and pt wife Misty. Explained that pt would receive a call from cardiology and neurology to make a f/u appointment and explained that if for some reason they did not hear from either to call the number listed on the d/c paper work. Also discussed that pt would need speech therapy and physical therapy outpatient and that they would receive a phone call about that as well. Explained all new medications listed at the importance of those medications. Covid information was also explained to both pt and wife about ways to prevent the spread of the virus. Pt and family denies any questions regarding d/c teaching. Pt is eagered to be discharged.

## 2019-07-22 NOTE — Progress Notes (Signed)
STROKE TEAM PROGRESS NOTE   INTERVAL HISTORY Pt lying in bed, speech continues to improve. Naming and repeating better than yesterday but still intermittent hesitant speech. TCD bubble study showed positive for PFO or ASD.    Vitals:   07/22/19 0400 07/22/19 0500 07/22/19 0600 07/22/19 0700  BP: (!) 146/72 128/70 132/64 130/71  Pulse: 60 (!) 57 60 60  Resp: 17 11 11 12   Temp: 98.6 F (37 C)     TempSrc: Oral     SpO2: 99% 98% 97% 98%  Weight:      Height:        CBC:  Recent Labs  Lab 07/20/19 0750  07/21/19 0355 07/22/19 0440  WBC 10.3  --  15.2* 11.6*  NEUTROABS 6.4  --  9.5*  --   HGB 12.8*   < > 11.9* 12.4*  HCT 37.9*   < > 35.6* 35.5*  MCV 91.3  --  91.8 88.1  PLT 208  --  191 194   < > = values in this interval not displayed.    Basic Metabolic Panel:  Recent Labs  Lab 07/21/19 0355 07/22/19 0440  NA 139 142  K 4.1 3.8  CL 107 107  CO2 23 25  GLUCOSE 90 104*  BUN 12 12  CREATININE 0.70 0.75  CALCIUM 9.0 9.0   Lipid Panel:     Component Value Date/Time   CHOL 99 07/21/2019 0355   TRIG 88 07/21/2019 0355   HDL 38 (L) 07/21/2019 0355   CHOLHDL 2.6 07/21/2019 0355   VLDL 18 07/21/2019 0355   LDLCALC 43 07/21/2019 0355   HgbA1c:  Lab Results  Component Value Date   HGBA1C 5.3 07/21/2019   Urine Drug Screen:     Component Value Date/Time   LABOPIA NONE DETECTED 07/20/2019 1300   COCAINSCRNUR NONE DETECTED 07/20/2019 1300   LABBENZ POSITIVE (A) 07/20/2019 1300   AMPHETMU NONE DETECTED 07/20/2019 1300   THCU NONE DETECTED 07/20/2019 1300   LABBARB NONE DETECTED 07/20/2019 1300    Alcohol Level     Component Value Date/Time   ETH <10 07/20/2019 0750    IMAGING    Mr Brain Wo Contrast 07/21/2019 1. Moderate to large acute/early subacute infarct involving the left frontal and temporal lobes, left insula and subinsular region and left basal ganglia. 2. Regional mass effect with mild effacement of the left lateral ventricle. 1-2 mm midline  shift. 3. Paranasal sinus disease as described.   Mr Jodene Nam Head Wo Contrast 07/21/2019  No intracranial large vessel occlusion or proximal high-grade arterial stenosis. Interval recanalization of the left carotid artery terminus as well as proximal left anterior and middle cerebral arteries.  Cerebral Angiogram 07/20/2019 S/P Lt common carotid arteriogram followed by complete revascularization of Lt ICA terminus and Lt MCA occlusion with x 1 pass with solitaire 73mmx 4mm x retriever device and penumbra suction achieving a TICI 3 revascularization  Ct Angio Head W Or Wo Contrast Ct Angio Neck W And/or Wo Contrast 07/20/2019 Thrombus at the left ICA terminus extending into A1 and M1 segments. There is reconstitution of the left A1 likely via patent anterior communicating artery. Reconstitution of more distal left MCA branches with diminished flow. No measurable stenosis in the neck.   Ct Head Code Stroke Wo Contrast 07/20/2019 1. No acute intracranial hemorrhage or evidence of acute infarction. 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 2. ASPECTS is 10   TCD w/ bubble 07/22/2019 Impression - positive PFO or ASD,  rest spencer degree III, valsalva curtain effect   2D Echocardiogram  1. Left ventricular ejection fraction, by visual estimation, is 45%. The left ventricle has mildly decreased function. There is no left ventricular hypertrophy.  2. The left ventricle demonstrates global hypokinesis.  3. Left ventricular diastolic parameters are indeterminate.  4. Global right ventricle has mildly reduced systolic function.The right ventricular size is normal. No increase in right ventricular wall thickness.  5. Left atrial size was normal.  6. Right atrial size was normal.  7. The mitral valve is normal in structure. No evidence of mitral valve regurgitation. No evidence of mitral stenosis.  8. The aortic valve is tricuspid. Aortic valve regurgitation is not  visualized. No evidence of aortic valve sclerosis or stenosis.  9. The inferior vena cava is dilated in size with <50% respiratory variability, suggesting right atrial pressure of 15 mmHg. 10. The tricuspid valve is normal in structure. Tricuspid valve regurgitation is not demonstrated. 11. TR signal is inadequate for assessing pulmonary artery systolic pressure.  LE Doppler 07/21/2019 Right: There is no evidence of deep vein thrombosis in the lower extremity. No cystic structure found in the popliteal fossa. Left: There is no evidence of deep vein thrombosis in the lower extremity. No cystic structure found in the popliteal fossa.  Dg Chest Port 1 View 07/20/2019 Endotracheal tube as described.  No acute abnormality noted.    PHYSICAL EXAM  Temp:  [98.5 F (36.9 C)-100.1 F (37.8 C)] 98.6 F (37 C) (11/20 0400) Pulse Rate:  [52-68] 60 (11/20 0700) Resp:  [7-17] 12 (11/20 0700) BP: (115-147)/(59-102) 130/71 (11/20 0700) SpO2:  [97 %-100 %] 98 % (11/20 0700)  General - Well nourished, well developed, in no apparent distress.  Ophthalmologic - fundi not visualized due to noncooperation.  Cardiovascular - Regular rhythm and rate.  Mental Status -  Level of arousal and orientation to time, place, and person were intact. Language including repetition, comprehension was assessed and found intact. Able to name 3/4 objects, intermittent hesitancy of speech.   Cranial Nerves II - XII - II - Visual field intact OU. III, IV, VI - Extraocular movements intact. V - Facial sensation intact bilaterally. VII - mild right facial droop. VIII - Hearing & vestibular intact bilaterally. X - Palate elevates symmetrically. XI - Chin turning & shoulder shrug intact bilaterally. XII - Tongue protrusion intact.  Motor Strength - The patient's strength was normal in all extremities and pronator drift was absent.  Bulk was normal and fasciculations were absent.   Motor Tone - Muscle tone was  assessed at the neck and appendages and was normal.  Reflexes - The patient's reflexes were symmetrical in all extremities and he had no pathological reflexes.  Sensory - Light touch, temperature/pinprick were assessed and were symmetrical.    Coordination - The patient had normal movements in the hands and feet with no ataxia or dysmetria.  Tremor was absent.  Gait and Station - deferred.   ASSESSMENT/PLAN Mr. Matthew Stanton is a 48 y.o. male with history of OSA and positive COVID test 07/15/2019 presenting to [facility] with R sided weakness and aphasia. Received IV tPA 11/21/04/2019 at [place].  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 increased density L MCA.  ASPECTS 10.     CTA head L ICA terminus thrombus extending into L A1 and L M1,  reconstitution at L A1 via ACom. L MCA w/ diminished flow  CTA neck unremarkable   Cerebral angio L ICA terminus and L MCA occlusions w/ TICI3 revascularization   Post IR CT no ICH or mass effect  MRI  Moderate to large acute L frontal lobe and temporal lobes, L insular and subinsular and L basal ganglia infarcts.   MRA  Interval recanalization L ICA terminus and proximal L ACA and L MCA.  2D Echo EF 45%. No source of embolus   LE doppler neg DVT  TCD bubble study positive for PFO or ASD   LDL 43  HgbA1c 5.3  Hypercoagulable and autoimmune labs negative but some are still pending  Lovenox 40 mg sq daily  for VTE prophylaxis  No antithrombotic prior to admission, now on ASA 81 and plavix DAPT. Continue DAPT until sees cardiology at outpt.    Therapy recommendations:  OP PT, OP SLP    Disposition:  Anticipate d/c home  COVID infection  07/14/2019 pt lost smell and taste  Initial test 07/15/2019 positive; repeat test 07/20/2019 positive  Does not have COVID PNA  No role for therapeutics per CCM  ASD  vs. PFO - likely related to current stroke  As per wife, pt had "hole in heart" since age 58-4  No surgery done  TCD bubble positive for ASD or PFO (rest spencer degree III, valsalva curtain effect)  Cardiology will follow up as outpt to do TEE once COVID resolved.   ROPE score  = 8  Will refer to Dr. Burt Knack to consider PFO/ASD closure in the future  Cardiomyopathy  2D Echo EF 45%. No source of embolus   ? Related to long term ASD  Cardiology consulted (Skains) added ARB (HR too low for BB). F/u as OP.   Other Stroke Risk Factors  Former Cigarette smoker, quit 21 yrs ago  Hx ETOH use  Obstructive sleep apnea, on CPAP at home  Other Active Problems  Acute blood loss anemia s/p IR 12.8-11.6-11.9-12.4   Leukocytosis WBC 10.3->15.2->11.6   Hospital day # 2  Neurology will sign off. Please call with questions. Pt will follow up with stroke clinic NP at [facility] in about 4 weeks. Thanks for the consult.   Rosalin Hawking, MD PhD Stroke Neurology 07/22/2019 9:19 AM    To contact Stroke Continuity provider, please refer to [email or web address] After hours, contact General Neurology

## 2019-07-22 NOTE — Progress Notes (Signed)
TCD Bubble Study  has been completed. Refer to [facility] under chart review to view preliminary results.   07/22/2019  4:03 PM Ruberta Holck, Bonnye Fava
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
## 2019-07-22 NOTE — Discharge Summary (Addendum)
Physician Discharge Summary  Matthew Stanton [id number] DOB: Sep 29, 1970 DOA: 07/20/2019  PCP: [name], MD  Admit date: 07/20/2019 Discharge date: 07/22/2019  Admitted From: Home Disposition: Home  Recommendations for Outpatient Follow-up:  1. Follow up with PCP in 1-2 weeks 2. Please follow-up with neurology and cardiology according to your scheduled appointment. 3. Please obtain BMP/CBC in one week 4. Please follow up on the following pending results: Hypercoagulable and autoimmune labs.  Home Health: No Equipment/Devices: None Discharge Condition: Stable CODE STATUS: Full Diet recommendation: Heart Healthy   Brief/Interim Summary: Matthew Stanton is a 48 y.o. male with a hx of OSA (unclear if on CPAP), Covid positive on 11/13, no respiratory symptoms, was seen at [facility] emergency room with right-sided weakness and aphasia.  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  Given TPA and was transferred to [facility] for thrombectomy with IR.  Also found to have cardiomyopathy with ejection fraction of 45% and diffuse hypokinesis.  Cardiology was consulted and it was thought to be due to stroke and they will monitor and hoping that his cardiac functions will recover. Patient initially monitored in ICU.  Remained stable and wants to be discharged today after TCD bubble studies. He was found to have positive bubble studies and will follow up with cardiology for a possible closure once cleared from Covid. Patient will get monitor for a possible atrial fibrillation by cardiology and will follow up with them. Patient will also follow-up with neurology. He was having mild right facial droop on discharge.  No other residual deficit. He should remain quarantined for 2 weeks.  Discharge Diagnoses:  Active Problems:   Stroke (cerebrum) (HCC)   Middle cerebral artery embolism, left   History of ETT   COVID-19 virus detected  Discharge Instructions  Discharge Instructions    Diet -  low sodium heart healthy   Complete by: As directed    Increase activity slowly   Complete by: As directed    MyChart COVID-19 home monitoring program   Complete by: Jul 22, 2019    Is the patient willing to use the [facility] for home monitoring?: Yes   Temperature monitoring   Complete by: Jul 22, 2019    After how many days would you like to receive a notification of this patient's flowsheet entries?: 1     Allergies as of 07/22/2019   No Known Allergies     Medication List    STOP taking these medications   azithromycin 250 MG tablet Commonly known as: ZITHROMAX     TAKE these medications   aspirin 81 MG EC tablet Take 1 tablet (81 mg total) by mouth daily. Start taking on: July 23, 2019   atorvastatin 40 MG tablet Commonly known as: Lipitor Take 1 tablet (40 mg total) by mouth daily.   benzonatate 100 MG capsule Commonly known as: TESSALON Take 100 mg by mouth 4 (four) times daily as needed for cough.   clopidogrel 75 MG tablet Commonly known as: PLAVIX Take 1 tablet (75 mg total) by mouth daily. Start taking on: July 23, 2019   losartan 25 MG tablet Commonly known as: COZAAR Take 1 tablet (25 mg total) by mouth daily. Start taking on: July 23, 2019   pantoprazole 40 MG tablet Commonly known as: PROTONIX Take 1 tablet (40 mg total) by mouth daily. Start taking on: July 23, 2019   predniSONE 10 MG (21) Tbpk tablet Commonly known as: STERAPRED UNI-PAK 21 TAB as directed. Per  package instructions   tadalafil 5 MG tablet Commonly known as: CIALIS Take 5 mg by mouth daily as needed for erectile dysfunction.      Follow-up Information    Jerline Pain, MD Follow up.   Specialty: Cardiology Why: You will be called to schedule a cardiology follow up to be done in about 2 months.  You will also be called to arrange for an outpatient cardiac monitor which will be mailed to you.  Contact information: [id number] [place] [address] [place] Follow up.   Why: Facility on [employer] will contact you after dicharge for recommended outpatient physical therapy         No Known Allergies  Consultations:  Cardiology  Neurology  Procedures/Studies: Ct Angio Head W Or Wo Contrast  Result Date: 07/20/2019 CLINICAL DATA:  Right-sided weakness EXAM: CT ANGIOGRAPHY HEAD AND NECK TECHNIQUE: Multidetector CT imaging of the head and neck was performed using the standard protocol during bolus administration of intravenous contrast. Multiplanar CT image reconstructions and MIPs were obtained to evaluate the vascular anatomy. Carotid stenosis measurements (when applicable) are obtained utilizing NASCET criteria, using the distal internal carotid diameter as the denominator. CONTRAST:  161mL OMNIPAQUE IOHEXOL 350 MG/ML SOLN COMPARISON:  None. FINDINGS: CTA NECK FINDINGS Aortic arch: Common origin of the innominate and left common carotid arteries. Great vessel origins are patent. Right carotid system: Common, internal, and external carotid arteries are patent. There is no measurable stenosis the proximal ICA. Left carotid system: Common, internal, and external carotid arteries are patent. There is minimal calcified and noncalcified plaque at the ICA origin without measurable stenosis. Vertebral arteries: Patent. Left vertebral artery is dominant. No stenosis or evidence of dissection. Skeleton: No acute abnormality. Other neck: No neck mass or adenopathy. Upper chest: No apical lung mass. Review of the MIP images confirms the above findings CTA HEAD FINDINGS Anterior circulation: Intracranial internal carotid arteries are patent. There is loss of enhancement at the left carotid terminus extending into the left M1 MCA. There is reconstitution M2 MCA branches with diminished flow. Also enhancement also involves the left ACA origin with subsequent  reconstitution. Right middle and anterior cerebral arteries are patent. There is a patent anterior communicating artery. Posterior circulation: Intracranial vertebral arteries, basilar artery, and posterior cerebral arteries are patent. Venous sinuses: Patent as permitted by contrast timing. Anatomic variants: None Review of the MIP images confirms the above findings IMPRESSION: Thrombus at the left ICA terminus extending into A1 and M1 segments. There is reconstitution of the left A1 likely via patent anterior communicating artery. Reconstitution of more distal left MCA branches with diminished flow. No measurable stenosis in the neck. Electronically Signed   By: Macy Mis M.D.   On: 07/20/2019 08:08   Ct Angio Neck W And/or Wo Contrast  Result Date: 07/20/2019 CLINICAL DATA:  Right-sided weakness EXAM: CT ANGIOGRAPHY HEAD AND NECK TECHNIQUE: Multidetector CT imaging of the head and neck was performed using the standard protocol during bolus administration of intravenous contrast. Multiplanar CT image reconstructions and MIPs were obtained to evaluate the vascular anatomy. Carotid stenosis measurements (when applicable) are obtained utilizing NASCET criteria, using the distal internal carotid diameter as the denominator. CONTRAST:  151mL OMNIPAQUE IOHEXOL 350 MG/ML SOLN COMPARISON:  None. FINDINGS: CTA NECK FINDINGS Aortic arch: Common origin of the innominate and left common carotid arteries. Great vessel origins are patent. Right carotid system: Common,  internal, and external carotid arteries are patent. There is no measurable stenosis the proximal ICA. Left carotid system: Common, internal, and external carotid arteries are patent. There is minimal calcified and noncalcified plaque at the ICA origin without measurable stenosis. Vertebral arteries: Patent. Left vertebral artery is dominant. No stenosis or evidence of dissection. Skeleton: No acute abnormality. Other neck: No neck mass or adenopathy.  Upper chest: No apical lung mass. Review of the MIP images confirms the above findings CTA HEAD FINDINGS Anterior circulation: Intracranial internal carotid arteries are patent. There is loss of enhancement at the left carotid terminus extending into the left M1 MCA. There is reconstitution M2 MCA branches with diminished flow. Also enhancement also involves the left ACA origin with subsequent reconstitution. Right middle and anterior cerebral arteries are patent. There is a patent anterior communicating artery. Posterior circulation: Intracranial vertebral arteries, basilar artery, and posterior cerebral arteries are patent. Venous sinuses: Patent as permitted by contrast timing. Anatomic variants: None Review of the MIP images confirms the above findings IMPRESSION: Thrombus at the left ICA terminus extending into A1 and M1 segments. There is reconstitution of the left A1 likely via patent anterior communicating artery. Reconstitution of more distal left MCA branches with diminished flow. No measurable stenosis in the neck. Electronically Signed   By: Macy Mis M.D.   On: 07/20/2019 08:08   Mr Jodene Nam Head Wo Contrast  Result Date: 07/21/2019 CLINICAL DATA:  Stroke, follow-up. EXAM: MRI HEAD WITHOUT CONTRAST MRA HEAD WITHOUT CONTRAST TECHNIQUE: Multiplanar, multiecho pulse sequences of the brain and surrounding structures were obtained without intravenous contrast. Angiographic images of the head were obtained using MRA technique without contrast. COMPARISON:  CT angiogram head/neck 07/20/2019, noncontrast head CT 07/20/2019 FINDINGS: MRI HEAD FINDINGS Brain: There is a moderate to large region of abnormal restricted diffusion consistent with acute/early subacute infarct within the left MCA vascular territory, involving the left frontal and temporal lobe cortex, left insula and subinsular region as well as left basal ganglia. Corresponding T2/FLAIR hyperintensity at this site. Regional mass effect with mild  effacement of the left lateral ventricle. 1-2 mm midline shift. Evidence of intracranial hemorrhage. Vascular: Reported separate Skull and upper cervical spine: No focal marrow lesion Sinuses/Orbits: Visualized orbits demonstrate no acute abnormality. Redemonstrated chronic sinusitis with mucosal thickening greatest within the inferior left frontal sinus and bilateral ethmoid air cells. MRA HEAD FINDINGS The intracranial internal carotid arteries are patent without significant stenosis. There has been interval recanalization of the left carotid artery terminus as well as proximal left anterior and middle cerebral arteries. The intracranial vertebral arteries are patent without significant stenosis, as is the basilar artery. The bilateral posterior cerebral arteries are patent without significant proximal stenosis. No intracranial aneurysm is identified. These results were called by telephone at the time of interpretation on 07/21/2019 at 11:30 am to provider [facility] , who verbally acknowledged these results. IMPRESSION: Brain MRI: 1. Moderate to large acute/early subacute infarct involving the left frontal and temporal lobes, left insula and subinsular region and left basal ganglia. 2. Regional mass effect with mild effacement of the left lateral ventricle. 1-2 mm midline shift. 3. Paranasal sinus disease as described. MRA head: No intracranial large vessel occlusion or proximal high-grade arterial stenosis. Interval recanalization of the left carotid artery terminus as well as proximal left anterior and middle cerebral arteries. Electronically Signed   By: Kellie Simmering DO   On: 07/21/2019 11:30   Mr Brain Wo Contrast  Result Date: 07/21/2019 CLINICAL DATA:  Stroke,  follow-up. EXAM: MRI HEAD WITHOUT CONTRAST MRA HEAD WITHOUT CONTRAST TECHNIQUE: Multiplanar, multiecho pulse sequences of the brain and surrounding structures were obtained without intravenous contrast. Angiographic images of the head were  obtained using MRA technique without contrast. COMPARISON:  CT angiogram head/neck 07/20/2019, noncontrast head CT 07/20/2019 FINDINGS: MRI HEAD FINDINGS Brain: There is a moderate to large region of abnormal restricted diffusion consistent with acute/early subacute infarct within the left MCA vascular territory, involving the left frontal and temporal lobe cortex, left insula and subinsular region as well as left basal ganglia. Corresponding T2/FLAIR hyperintensity at this site. Regional mass effect with mild effacement of the left lateral ventricle. 1-2 mm midline shift. Evidence of intracranial hemorrhage. Vascular: Reported separate Skull and upper cervical spine: No focal marrow lesion Sinuses/Orbits: Visualized orbits demonstrate no acute abnormality. Redemonstrated chronic sinusitis with mucosal thickening greatest within the inferior left frontal sinus and bilateral ethmoid air cells. MRA HEAD FINDINGS The intracranial internal carotid arteries are patent without significant stenosis. There has been interval recanalization of the left carotid artery terminus as well as proximal left anterior and middle cerebral arteries. The intracranial vertebral arteries are patent without significant stenosis, as is the basilar artery. The bilateral posterior cerebral arteries are patent without significant proximal stenosis. No intracranial aneurysm is identified. These results were called by telephone at the time of interpretation on 07/21/2019 at 11:30 am to provider [facility] , who verbally acknowledged these results. IMPRESSION: Brain MRI: 1. Moderate to large acute/early subacute infarct involving the left frontal and temporal lobes, left insula and subinsular region and left basal ganglia. 2. Regional mass effect with mild effacement of the left lateral ventricle. 1-2 mm midline shift. 3. Paranasal sinus disease as described. MRA head: No intracranial large vessel occlusion or proximal high-grade arterial  stenosis. Interval recanalization of the left carotid artery terminus as well as proximal left anterior and middle cerebral arteries. Electronically Signed   By: Kellie Simmering DO   On: 07/21/2019 11:30   Dg Chest Port 1 View  Result Date: 07/20/2019 CLINICAL DATA:  Check endotracheal tube placement EXAM: PORTABLE CHEST 1 VIEW COMPARISON:  None. FINDINGS: Endotracheal tube is noted 5.6 cm above the carina. Cardiac shadows within normal limits. The lungs are clear bilaterally. No acute bony abnormality is seen. IMPRESSION: Endotracheal tube as described.  No acute abnormality noted. Electronically Signed   By: Inez Catalina M.D.   On: 07/20/2019 16:21   Ct Head Code Stroke Wo Contrast  Result Date: 07/20/2019 CLINICAL DATA:  Code stroke.  Right-sided weakness EXAM: CT HEAD WITHOUT CONTRAST TECHNIQUE: Contiguous axial images were obtained from the base of the skull through the vertex without intravenous contrast. COMPARISON:  None. FINDINGS: Brain: There is no acute intracranial hemorrhage, mass effect, or edema. Gray-white differentiation remains preserved. Ventricles and sulci are normal in size and configuration. There is no extra-axial fluid collection. Vascular: Increased density of the left MCA. Skull: Calvarium is unremarkable. Sinuses/Orbits: Moderate paranasal sinus opacification primarily involving the ethmoids. Orbits are unremarkable. Other: None. ASPECTS [facility] Stroke Program Early CT Score) - Ganglionic level infarction (caudate, lentiform nuclei, internal capsule, insula, M1-M3 cortex): 7 - Supraganglionic infarction (M4-M6 cortex): 3 Total score (0-10 with 10 being normal): 10 IMPRESSION: 1. No acute intracranial hemorrhage or evidence of acute infarction. 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 2. ASPECTS is 10 These results were called by telephone at the time of interpretation on 07/20/2019 at 7:34 am to provider [facility] , who verbally acknowledged  these  results. Electronically Signed   By: Macy Mis M.D.   On: 07/20/2019 07:37   Vas [place] Lower Extremity Venous (dvt)  Result Date: 07/21/2019  Lower Venous Study Indications: Stroke.  Comparison Study: no prior Performing Technologist: Abram Sander RVS  Examination Guidelines: A complete evaluation includes B-mode imaging, spectral Doppler, color Doppler, and power Doppler as needed of all accessible portions of each vessel. Bilateral testing is considered an integral part of a complete examination. Limited examinations for reoccurring indications may be performed as noted.  +---------+---------------+---------+-----------+----------+--------------+ RIGHT    CompressibilityPhasicitySpontaneityPropertiesThrombus Aging +---------+---------------+---------+-----------+----------+--------------+ CFV      Full           Yes      Yes                                 +---------+---------------+---------+-----------+----------+--------------+ SFJ      Full                                                        +---------+---------------+---------+-----------+----------+--------------+ FV Prox  Full                                                        +---------+---------------+---------+-----------+----------+--------------+ FV Mid   Full                                                        +---------+---------------+---------+-----------+----------+--------------+ FV DistalFull                                                        +---------+---------------+---------+-----------+----------+--------------+ PFV      Full                                                        +---------+---------------+---------+-----------+----------+--------------+ POP      Full           Yes      Yes                                 +---------+---------------+---------+-----------+----------+--------------+ PTV      Full                                                         +---------+---------------+---------+-----------+----------+--------------+ PERO     Full                                                        +---------+---------------+---------+-----------+----------+--------------+   +---------+---------------+---------+-----------+----------+--------------+  LEFT     CompressibilityPhasicitySpontaneityPropertiesThrombus Aging +---------+---------------+---------+-----------+----------+--------------+ CFV      Full           Yes      Yes                                 +---------+---------------+---------+-----------+----------+--------------+ SFJ      Full                                                        +---------+---------------+---------+-----------+----------+--------------+ FV Prox  Full                                                        +---------+---------------+---------+-----------+----------+--------------+ FV Mid   Full                                                        +---------+---------------+---------+-----------+----------+--------------+ FV DistalFull                                                        +---------+---------------+---------+-----------+----------+--------------+ PFV      Full                                                        +---------+---------------+---------+-----------+----------+--------------+ POP      Full           Yes      Yes                                 +---------+---------------+---------+-----------+----------+--------------+ PTV      Full                                                        +---------+---------------+---------+-----------+----------+--------------+ PERO     Full                                                        +---------+---------------+---------+-----------+----------+--------------+     Summary: Right: There is no evidence of deep vein thrombosis in the lower extremity. No cystic structure found in  the popliteal fossa. Left: There is no evidence of deep vein thrombosis in the lower extremity. No cystic structure found in the popliteal fossa.  *  See table(s) above for measurements and observations. Electronically signed by Monica Martinez MD on 07/21/2019 at 7:14:34 PM.    Final     Subjective: Feeling better when seen this morning.  Wants to go home after bubble studies.  He was agreeable to keep himself quarantine and will follow up with cardiology and neurology both.  Discharge Exam: Vitals:   07/22/19 1100 07/22/19 1200  BP: (!) 151/85 (!) 143/73  Pulse: 63 60  Resp: 11 11  Temp:    SpO2: 97% 99%   Vitals:   07/22/19 0900 07/22/19 1000 07/22/19 1100 07/22/19 1200  BP: (!) 142/74 (!) 143/69 (!) 151/85 (!) 143/73  Pulse: 72 67 63 60  Resp: 19 11 11 11   Temp:      TempSrc:      SpO2: 99% 98% 97% 99%  Weight:      Height:       General: Pt is alert, awake, not in acute distress Cardiovascular: RRR, S1/S2 +, no rubs, no gallops Respiratory: CTA bilaterally, no wheezing, no rhonchi Abdominal: Soft, NT, ND, bowel sounds + Extremities: no edema, no cyanosis  The results of significant diagnostics from this hospitalization (including imaging, microbiology, ancillary and laboratory) are listed below for reference.     Microbiology: Recent Results (from the past 240 hour(s))  SARS Coronavirus 2 by RT PCR (hospital order, performed in [facility] hospital lab) Nasopharyngeal Nasopharyngeal Swab     Status: Abnormal   Collection Time: 07/20/19  9:02 AM   Specimen: Nasopharyngeal Swab  Result Value Ref Range Status   SARS Coronavirus 2 POSITIVE (A) NEGATIVE Final    Comment: RESULT CALLED TO, READ BACK BY AND VERIFIED WITH: WHITE M. AT 1050A ON [id number] BY THOMPSON S. Performed at [facility], [address] [place], [place] [address]   MRSA PCR Screening     Status: None   Collection Time: 07/20/19 11:46 AM   Specimen: Nasopharyngeal Wash  Result Value Ref Range Status    MRSA by PCR NEGATIVE NEGATIVE Final    Comment:        The GeneXpert MRSA Assay (FDA approved for NASAL specimens only), is one component of a comprehensive MRSA colonization surveillance program. It is not intended to diagnose MRSA infection nor to guide or monitor treatment for MRSA infections. Performed at [facility], [place] [address] [place], [place] [address]      Labs: BNP (last 3 results) No results for input(s): BNP in the last 8760 hours. Basic Metabolic Panel: Recent Labs  Lab 07/20/19 0750 07/20/19 1143 07/21/19 0355 07/22/19 0440  NA 137 137 139 142  K 3.8 3.8 4.1 3.8  CL 107  --  107 107  CO2 23  --  23 25  GLUCOSE 103*  --  90 104*  BUN 16  --  12 12  CREATININE 0.86  --  0.70 0.75  CALCIUM 8.6*  --  9.0 9.0   Liver Function Tests: Recent Labs  Lab 07/20/19 0750  AST 14*  ALT 19  ALKPHOS 47  BILITOT 0.9  PROT 6.3*  ALBUMIN 3.6   No results for input(s): LIPASE, AMYLASE in the last 168 hours. No results for input(s): AMMONIA in the last 168 hours. CBC: Recent Labs  Lab 07/20/19 0750 07/20/19 1143 07/21/19 0355 07/22/19 0440  WBC 10.3  --  15.2* 11.6*  NEUTROABS 6.4  --  9.5*  --   HGB 12.8* 11.6* 11.9* 12.4*  HCT 37.9* 34.0* 35.6* 35.5*  MCV 91.3  --  91.8 88.1  PLT 208  --  191 194   Cardiac Enzymes: No results for input(s): CKTOTAL, CKMB, CKMBINDEX, TROPONINI in the last 168 hours. BNP: Invalid input(s): POCBNP CBG: Recent Labs  Lab 07/21/19 1527 07/21/19 2004 07/21/19 2330 07/22/19 0356 07/22/19 0741  GLUCAP 88 107* 137* 105* 102*   D-Dimer Recent Labs    07/21/19 0355 07/22/19 0440  DDIMER 3.14* 2.48*   Hgb A1c Recent Labs    07/21/19 0355  HGBA1C 5.3   Lipid Profile Recent Labs    07/21/19 0355  CHOL 99  HDL 38*  LDLCALC 43  TRIG 88  CHOLHDL 2.6   Thyroid function studies Recent Labs    07/21/19 0355  TSH 2.487   Anemia work up Recent Labs    07/21/19 0355 07/22/19 0440  VITAMINB12  297  --   FERRITIN 102 112   Urinalysis    Component Value Date/Time   COLORURINE YELLOW 07/20/2019 1300   APPEARANCEUR CLEAR 07/20/2019 1300   LABSPEC >1.046 (H) 07/20/2019 1300   PHURINE 5.0 07/20/2019 1300   GLUCOSEU NEGATIVE 07/20/2019 1300   HGBUR NEGATIVE 07/20/2019 [place] 07/20/2019 1300   KETONESUR NEGATIVE 07/20/2019 1300   PROTEINUR 30 (A) 07/20/2019 1300   NITRITE NEGATIVE 07/20/2019 1300   LEUKOCYTESUR NEGATIVE 07/20/2019 1300   Sepsis Labs Invalid input(s): PROCALCITONIN,  WBC,  LACTICIDVEN Microbiology Recent Results (from the past 240 hour(s))  SARS Coronavirus 2 by RT PCR (hospital order, performed in [place] hospital lab) Nasopharyngeal Nasopharyngeal Swab     Status: Abnormal   Collection Time: 07/20/19  9:02 AM   Specimen: Nasopharyngeal Swab  Result Value Ref Range Status   SARS Coronavirus 2 POSITIVE (A) NEGATIVE Final    Comment: RESULT CALLED TO, READ BACK BY AND VERIFIED WITH: WHITE M. AT 1050A ON [id number] BY THOMPSON S. Performed at [facility], [address], [place], [place] [address]   MRSA PCR Screening     Status: None   Collection Time: 07/20/19 11:46 AM   Specimen: Nasopharyngeal Wash  Result Value Ref Range Status   MRSA by PCR NEGATIVE NEGATIVE Final    Comment:        The GeneXpert MRSA Assay (FDA approved for NASAL specimens only), is one component of a comprehensive MRSA colonization surveillance program. It is not intended to diagnose MRSA infection nor to guide or monitor treatment for MRSA infections. Performed at [facility], [place] [address], [place], [place] [address]     Time coordinating discharge: Over 30 minutes  SIGNED:  Lorella Nimrod, MD  [facility] 07/22/2019, 2:55 PM Pager [phone]  If 7PM-7AM, please contact night-coverage [email or web address] Password [email or web address]  This record has been created using Systems analyst. Errors have been sought and corrected,but  may not always be located. Such creation errors do not reflect on the standard of care.

## 2019-07-25 LAB — PROTHROMBIN GENE MUTATION

## 2019-07-26 ENCOUNTER — Encounter (HOSPITAL_COMMUNITY): Payer: Self-pay | Admitting: Interventional Radiology

## 2019-07-27 ENCOUNTER — Telehealth: Payer: Self-pay

## 2019-07-27 NOTE — Telephone Encounter (Signed)
14 day ZIO AT ordered and mailed to pt.

## 2019-07-28 LAB — ANTINUCLEAR ANTIBODIES, IFA: ANA Ab, IFA: NEGATIVE

## 2019-08-01 LAB — MTHFR DNA ANALYSIS

## 2019-08-02 ENCOUNTER — Other Ambulatory Visit: Payer: Self-pay | Admitting: Internal Medicine

## 2019-08-02 ENCOUNTER — Other Ambulatory Visit: Payer: Self-pay | Admitting: *Deleted

## 2019-08-02 NOTE — Patient Outreach (Signed)
[facility]) Care Management  08/02/2019  Matthew Stanton 1971-08-09 [id number]   Subjective: Telephone call to patient's home  / mobile number, no answer, left HIPAA compliant voicemail message, and requested call back.    Objective: Per KPN (Knowledge Performance Now, point of care tool) and chart review, patient hospitalized 07/20/2019 - 07/22/2019 for Stroke (cerebrum), Middle cerebral artery embolism, left, COVID-19 virus detected.  Patient was seen at [facility] emergency room with right-sided weakness and aphasia.  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  Given TPA  (tissue Plasminogen Activator) and was transferred to [facility] for thrombectomy with IR (Intreventional Radiology)  Patient also has a history of Covid positive on 07/15/2019, no respiratory symptoms, and OSA.      Assessment: Received [employer] EMMI Stroke Google Alert follow up referral on 08/01/2019.   Red Flag Trigger, Day #6, patient answered yes to the following question:  Smoked or been around smoke?  [facility] EMMI follow up pending patient contact.      Plan: RNCM will send unsuccessful outreach letter, [facility] pamphlet, handout: Know Before You Go, will call patient for 2nd telephone outreach attempt within 4 business days, [facility] EMMI follow up, and proceed with case closure, within 10 business days if no return call.      Moksha Dorgan H. Annia Friendly, BSN, [place] Management [facility] Telephonic CM Phone: [phone] Fax: [phone]

## 2019-08-05 ENCOUNTER — Telehealth: Payer: Self-pay

## 2019-08-05 ENCOUNTER — Encounter: Payer: Self-pay | Admitting: *Deleted

## 2019-08-05 ENCOUNTER — Other Ambulatory Visit: Payer: Self-pay | Admitting: *Deleted

## 2019-08-05 NOTE — Patient Outreach (Signed)
[facility]) Care Management  08/05/2019  Matthew Stanton July 30, 1971 [id number]   Subjective: Telephone call to patient's home / mobile number, spoke with patient, and HIPAA verified.  Discussed Matthew Stanton EMMI Stroke Red Flag Alert follow up, patient voiced understanding, and is in agreement to follow up.  Patient states he is doing great, ready to go back to work, remembers receiving EMMI automated calls, and system captured incorrect answer.  States he does not smoke and has not been around smoke.  RNCM verbally reviewed To Do section of discharge instructions with patient, patient voices understanding, states he has discharge instructions, will access as needed, and follow up as needed. States he had a phone visit with primary MD on 08/01/2019 and visit went well.  States he has a follow up visit with cardiologist on 08/11/2019 and will follow with MD at that time regarding cardiac monitoring.   States he has a follow up appointment with neurologist on 08/31/2019.  States he has not received follow up call regarding outpatient therapy appointments and will follow up with provider if no call back received by 08/11/2019.  Patient states he is able to manage self care and has assistance as needed.  Patient voices understanding of medical diagnosis and treatment plan.  States he is accessing his  [place] [employer] benefits as needed via member services number on back of card.  Patient states he does not have any education material, EMMI follow up, care coordination, care management, disease monitoring, transportation, community resource, or pharmacy needs at this time.  States he is very appreciative of the follow up,  is in agreement to receive [place] Management information, and EMMI follow up calls as needed.    Objective: Per KPN (Knowledge Performance Now, point of care tool) and chart review, patient hospitalized 07/20/2019 - 07/22/2019 for Stroke  (cerebrum), Middle cerebral artery embolism, left, COVID-19 virus detected.  Patient was seen at [facility] emergency room with right-sided weakness and aphasia. 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 Given TPA  (tissue Plasminogen Activator) and was transferred to [facility] for thrombectomy with IR (Intreventional Radiology)  Patient also has a history of Covid positive on 07/15/2019, no respiratory symptoms, and OSA.      Assessment: Received [employer] EMMI Stroke Google Alert follow up referral on 08/01/2019.  Red Flag Trigger, Day #6, patient answered yes to the following question:  Smoked or been around smoke?  EMMI follow up completed and no further care management needs.      Plan: RNCM will send patient successful outreach letter, [facility] pamphlet, and magnet. RNCM will complete case closure due to follow up completed / no care management needs.      Chiamaka Latka H. Annia Friendly, BSN, [place] Management [facility] Telephonic CM Phone: [phone] Fax: [phone]

## 2019-08-05 NOTE — Telephone Encounter (Signed)
The patient was COVID+ 11/18. Confirmed he has been asymptomatic for at least 2 weeks. Closure consult scheduled 12/10 with Dr. Burt Knack. The patient was grateful for call and agrees with treatment plan.

## 2019-08-09 ENCOUNTER — Other Ambulatory Visit (INDEPENDENT_AMBULATORY_CARE_PROVIDER_SITE_OTHER): Payer: 59

## 2019-08-09 DIAGNOSIS — I639 Cerebral infarction, unspecified: Secondary | ICD-10-CM | POA: Diagnosis not present

## 2019-08-11 ENCOUNTER — Ambulatory Visit: Payer: 59 | Admitting: Cardiovascular Disease

## 2019-08-11 ENCOUNTER — Encounter: Payer: Self-pay | Admitting: Cardiovascular Disease

## 2019-08-11 ENCOUNTER — Other Ambulatory Visit: Payer: Self-pay

## 2019-08-11 VITALS — BP 118/72 | HR 74 | Ht 67.0 in | Wt 186.0 lb

## 2019-08-11 DIAGNOSIS — Q211 Atrial septal defect: Secondary | ICD-10-CM | POA: Diagnosis not present

## 2019-08-11 DIAGNOSIS — Q2112 Patent foramen ovale: Secondary | ICD-10-CM

## 2019-08-11 LAB — CBC WITH DIFFERENTIAL/PLATELET
Basophils Absolute: 0 10*3/uL (ref 0.0–0.2)
Basos: 0 %
EOS (ABSOLUTE): 0.1 10*3/uL (ref 0.0–0.4)
Eos: 2 %
Hematocrit: 38.9 % (ref 37.5–51.0)
Hemoglobin: 13.2 g/dL (ref 13.0–17.7)
Immature Grans (Abs): 0 10*3/uL (ref 0.0–0.1)
Immature Granulocytes: 0 %
Lymphocytes Absolute: 2 10*3/uL (ref 0.7–3.1)
Lymphs: 28 %
MCH: 30.9 pg (ref 26.6–33.0)
MCHC: 33.9 g/dL (ref 31.5–35.7)
MCV: 91 fL (ref 79–97)
Monocytes Absolute: 0.7 10*3/uL (ref 0.1–0.9)
Monocytes: 10 %
Neutrophils Absolute: 4.3 10*3/uL (ref 1.4–7.0)
Neutrophils: 60 %
Platelets: 219 10*3/uL (ref 150–450)
RBC: 4.27 x10E6/uL (ref 4.14–5.80)
RDW: 13.1 % (ref 11.6–15.4)
WBC: 7.2 10*3/uL (ref 3.4–10.8)

## 2019-08-11 LAB — BASIC METABOLIC PANEL
BUN/Creatinine Ratio: 18 (ref 9–20)
BUN: 17 mg/dL (ref 6–24)
CO2: 25 mmol/L (ref 20–29)
Calcium: 10.1 mg/dL (ref 8.7–10.2)
Chloride: 103 mmol/L (ref 96–106)
Creatinine, Ser: 0.95 mg/dL (ref 0.76–1.27)
GFR calc Af Amer: 110 mL/min/{1.73_m2} (ref >59)
GFR calc non Af Amer: 95 mL/min/{1.73_m2} (ref >59)
Glucose: 99 mg/dL (ref 65–99)
Potassium: 4.9 mmol/L (ref 3.5–5.2)
Sodium: 140 mmol/L (ref 134–144)

## 2019-08-11 NOTE — Progress Notes (Signed)
[facility]   STRUCTURAL HEART TEAM  Date:  08/11/2019   ID:  Matthew Stanton, DOB Jan 12, 1971, MRN [id number]  PCP:  [name], MD   Chief Complaint  Patient presents with  . PFO Evaluation     HISTORY OF PRESENT ILLNESS: Matthew Stanton is a 48 y.o. male who presents for evaluation of PFO, referred by Dr [name].  The patient presented November 18 with an acute left MCA infarct, treated with TPA and percutaneous intervention.  Presenting symptoms included sudden onset of right sided arm and leg weakness, complete expressive aphasia, and collapse.  Of note, he tested positive for COVID-19 on July 15, 2019 after losing sense of taste and smell.  He never developed COVID-19 pneumonia.  Inpatient work-up included a transcranial Doppler study which was positive for [name] degree 3 shunt at rest and curtain effect with Valsalva indicative of at least moderate intracardiac shunting.  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  He never underwent closure evaluation in the past.  States that he was followed at [facility] and was told that the hole closed spontaneously, never requiring intervention.  Also during his inpatient evaluation, he was noted to have mild LV dysfunction with LVEF 45%.  He was seen by Dr. [name] during his admission and losartan 25 mg was added to his medical regimen.  He was bradycardic and therefore was not a candidate for a beta-blocker.    The patient has previously been well.  He has exercise at the gym about 5 days/week with no exertional symptoms.  He specifically denies chest pain, chest pressure, or shortness of breath.  He is currently having no cardiac symptoms.  He has had an excellent recovery from his stroke and denies any residual problems with motor strength or function.  His speech has improved dramatically.  He is eager to start driving again.  The patient denies any history of nickel  allergy or reaction.  Past Medical History:  Diagnosis Date  . Lab test positive for detection of COVID-19 virus   . OSA (obstructive sleep apnea)    NPSG 1999-AHI 88/hr    Current Outpatient Medications  Medication Sig Dispense Refill  . aspirin EC 81 MG EC tablet Take 1 tablet (81 mg total) by mouth daily. 60 tablet 0  . atorvastatin (LIPITOR) 40 MG tablet Take 1 tablet (40 mg total) by mouth daily. 30 tablet 3  . clopidogrel (PLAVIX) 75 MG tablet Take 1 tablet (75 mg total) by mouth daily. 30 tablet 0  . losartan (COZAAR) 25 MG tablet Take 1 tablet (25 mg total) by mouth daily. 30 tablet 0  . pantoprazole (PROTONIX) 40 MG tablet Take 1 tablet (40 mg total) by mouth daily. 30 tablet 0   No current facility-administered medications for this visit.    ALLERGIES:   Patient has no known allergies.   SOCIAL HISTORY:  The patient  reports that he quit smoking about 21 years ago. His smokeless tobacco use includes snuff. He reports previous alcohol use. He reports that he does not use drugs.   FAMILY HISTORY:  The patient's family history includes Colon cancer in his paternal grandmother; Colon cancer (age of onset: 26) in his father; Sleep apnea in his brother.   REVIEW OF SYSTEMS:  All other systems are reviewed and negative.   PHYSICAL EXAM: VS:  BP 118/72   Pulse 74   Ht 5'  7" (1.702 m)   Wt 186 lb (84.4 kg)   SpO2 98%   BMI 29.13 kg/m  , BMI Body mass index is 29.13 kg/m. GEN: Well nourished, well developed, in no acute distress HEENT: normal Neck: No JVD. carotids 2+ without bruits or masses Cardiac: The heart is RRR without murmurs, rubs, or gallops. No edema. Pedal pulses 2+ = bilaterally  Respiratory:  clear to auscultation bilaterally GI: soft, nontender, nondistended, + BS MS: no deformity or atrophy Skin: warm and dry, no rash Neuro:  Strength and sensation are intact Psych: euthymic mood, full affect  EKG:  EKG from 07/20/2019 reviewed and demonstrates normal  sinus rhythm with nonspecific intraventricular conduction delay  RECENT LABS: 07/20/2019: ALT 19 07/21/2019: TSH 2.487 07/22/2019: BUN 12; Creatinine, Ser 0.75; Hemoglobin 12.4; Platelets 194; Potassium 3.8; Sodium 142  07/21/2019: Cholesterol 99; HDL 38; LDL Cholesterol 43; Total CHOL/HDL Ratio 2.6; Triglycerides 88; VLDL 18   Estimated Creatinine Clearance: 118.5 mL/min (by C-G formula based on SCr of 0.75 mg/dL).   Wt Readings from Last 3 Encounters:  08/11/19 186 lb (84.4 kg)  07/20/19 187 lb 6.3 oz (85 kg)  04/04/19 184 lb 12.8 oz (83.8 kg)     PERTINENT STUDIES: Echo: IMPRESSIONS    1. Left ventricular ejection fraction, by visual estimation, is 45%. The left ventricle has mildly decreased function. There is no left ventricular hypertrophy.  2. The left ventricle demonstrates global hypokinesis.  3. Left ventricular diastolic parameters are indeterminate.  4. Global right ventricle has mildly reduced systolic function.The right ventricular size is normal. No increase in right ventricular wall thickness.  5. Left atrial size was normal.  6. Right atrial size was normal.  7. The mitral valve is normal in structure. No evidence of mitral valve regurgitation. No evidence of mitral stenosis.  8. The aortic valve is tricuspid. Aortic valve regurgitation is not visualized. No evidence of aortic valve sclerosis or stenosis.  9. The inferior vena cava is dilated in size with <50% respiratory variability, suggesting right atrial pressure of 15 mmHg. 10. The tricuspid valve is normal in structure. Tricuspid valve regurgitation is not demonstrated. 11. TR signal is inadequate for assessing pulmonary artery systolic pressure.  FINDINGS  Left Ventricle: Left ventricular ejection fraction, by visual estimation, is 45%. The left ventricle has mildly decreased function. The left ventricle demonstrates global hypokinesis. The left ventricular internal cavity size was the left ventricle is   normal in size. There is no left ventricular hypertrophy. Left ventricular diastolic parameters are indeterminate.  Right Ventricle: The right ventricular size is normal. No increase in right ventricular wall thickness. Global RV systolic function is has mildly reduced systolic function.  Left Atrium: Left atrial size was normal in size.  Right Atrium: Right atrial size was normal in size  Pericardium: There is no evidence of pericardial effusion.  Mitral Valve: The mitral valve is normal in structure. No evidence of mitral valve stenosis by observation. No evidence of mitral valve regurgitation.  Tricuspid Valve: The tricuspid valve is normal in structure. Tricuspid valve regurgitation is not demonstrated.  Aortic Valve: The aortic valve is tricuspid. Aortic valve regurgitation is not visualized. The aortic valve is structurally normal, with no evidence of sclerosis or stenosis.  Pulmonic Valve: The pulmonic valve was not well visualized. Pulmonic valve regurgitation is not visualized.  Aorta: The aortic root is normal in size and structure.  Venous: The inferior vena cava is dilated in size with less than 50% respiratory variability, suggesting right  atrial pressure of 15 mmHg.  IAS/Shunts: No atrial level shunt detected by color flow Doppler.  Transcranial Doppler: Summary:   A vascular evaluation was performed. The right middle cerebral artery was studied. An IV was inserted into the patient's left Forearm. Verbal informed consent was obtained.   Dr. [name] performed. HITS heard at rest and during Valsalva: Suggestive [name] grade 3.  Positive TCd Bubble study indicative of moderate size right to left shunt   CTA Head/Neck: FINDINGS: CTA NECK FINDINGS  Aortic arch: Common origin of the innominate and left common carotid arteries. Great vessel origins are patent.  Right carotid system: Common, internal, and external carotid arteries are patent. There is no  measurable stenosis the proximal ICA.  Left carotid system: Common, internal, and external carotid arteries are patent. There is minimal calcified and noncalcified plaque at the ICA origin without measurable stenosis.  Vertebral arteries: Patent. Left vertebral artery is dominant. No stenosis or evidence of dissection.  Skeleton: No acute abnormality.  Other neck: No neck mass or adenopathy.  Upper chest: No apical lung mass.  Review of the MIP images confirms the above findings  CTA HEAD FINDINGS  Anterior circulation: Intracranial internal carotid arteries are patent. There is loss of enhancement at the left carotid terminus extending into the left M1 MCA. There is reconstitution M2 MCA branches with diminished flow. Also enhancement also involves the left ACA origin with subsequent reconstitution. Right middle and anterior cerebral arteries are patent. There is a patent anterior communicating artery.  Posterior circulation: Intracranial vertebral arteries, basilar artery, and posterior cerebral arteries are patent.  Venous sinuses: Patent as permitted by contrast timing.  Anatomic variants: None  Review of the MIP images confirms the above findings  IMPRESSION: Thrombus at the left ICA terminus extending into A1 and M1 segments. There is reconstitution of the left A1 likely via patent anterior communicating artery. Reconstitution of more distal left MCA branches with diminished flow.  No measurable stenosis in the neck.  MRI Brain: IMPRESSION: Brain MRI:  1. Moderate to large acute/early subacute infarct involving the left frontal and temporal lobes, left insula and subinsular region and left basal ganglia. 2. Regional mass effect with mild effacement of the left lateral ventricle. 1-2 mm midline shift. 3. Paranasal sinus disease as described.  MRA head:  No intracranial large vessel occlusion or proximal high-grade arterial stenosis.  Interval recanalization of the left carotid artery terminus as well as proximal left anterior and middle cerebral arteries.  ASSESSMENT AND PLAN: 1.  PFO: Radiographic results, hospital notes, lab results, and cardiac imaging data are reviewed.  The patient's 2D echocardiogram demonstrated mild LV systolic dysfunction in a global pattern.  He had no significant valvular disease.  There was no obvious interatrial septal defect based on color Doppler imaging.  His transcranial Doppler study is consistent with a moderate to large right to left intracardiac shunt, likely secondary to PFO.  The patient's Rope Score is 8, indicating a high probability that the patient's stroke is 'PFO-related.'  The patient is counseled about the association of PFO and cryptogenic stroke. Available clinical trial data is reviewed, specifically those trials comparing transcatheter PFO closure and medical therapy with antiplatelet drugs. The patient understands the potential benefit of PFO closure with respect to secondary stroke reduction compared with medical therapy alone. Specific risks of transcatheter PFO closure are reviewed with the patient. These risks include bleeding, infection, device embolization, stroke, cardiac perforation, tamponade, arrhythmia, MI, and late device erosion. He understands these serious  risks occur at low incidence of < 1%.  The patient's wife is conferenced in using [employer] administrator in light of the COVID-19 pandemic restrictions.  All of her questions are answered.  The patient understands the need to take dual antiplatelet therapy with aspirin and clopidogrel together for a minimum period of 3 months following transcatheter PFO closure.  They understand the need to follow SBE prophylaxis per guidelines for a period of 6 months following PFO closure.  The patient provides full informed consent for the procedure.   They understand that he will first require a transesophageal  echocardiogram to define interatrial septal anatomy and confirm the presence of a PFO.  I reviewed risks, indications, and alternatives to transesophageal echocardiogram study.  He also understands these risks and agrees to proceed.  Pending confirmation of a significant PFO on TEE, we will tentatively plan on transcatheter PFO closure following his TEE study on that same day.  Sherren Mocha 08/11/2019 9:29 AM

## 2019-08-11 NOTE — Patient Instructions (Addendum)
PROCEDURE INSTRUCTIONS: You are scheduled for a TEE and PFO CLOSURE on Wednesday, December 30.  1. Please arrive at the [facility] (Main Entrance A) at [facility]: [address] [place], [place] [address] at: [place] are allowed ONE visitor in the waiting room during your procedures. You and your guest must wear masks. Special note: Every effort is made to have your procedure done on time. Please understand that emergencies sometimes delay scheduled procedures.  2. Diet: Do stay very well hydrated the day before your procedure. Do not eat or drink anything after midnight except medications with sips of water.  3. Labs: TODAY!  4. Medication instructions in preparation for your procedure:  1) MAKE SURE TO TAKE YOUR ASPIRIN AND PLAVIX the morning of your procedures  2) You may take your other meds as directed.  5. Plan for one night stay--bring personal belongings. 6. Bring a current list of your medications and current insurance cards. 7. You MUST have a responsible person to drive you home. 8. Someone MUST be with you the first 24 hours after you arrive home or your discharge will be delayed. 9. Please wear clothes that are easy to get on and off and wear slip-on shoes.  Thank you for allowing [place] to care for you!   -- [place] [facility]

## 2019-08-19 ENCOUNTER — Ambulatory Visit: Payer: 59 | Attending: Internal Medicine

## 2019-08-19 ENCOUNTER — Other Ambulatory Visit: Payer: Self-pay

## 2019-08-19 VITALS — BP 110/72

## 2019-08-19 DIAGNOSIS — R4701 Aphasia: Secondary | ICD-10-CM | POA: Insufficient documentation

## 2019-08-19 DIAGNOSIS — R2689 Other abnormalities of gait and mobility: Secondary | ICD-10-CM | POA: Diagnosis not present
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
## 2019-08-24 ENCOUNTER — Other Ambulatory Visit: Payer: Self-pay

## 2019-08-24 ENCOUNTER — Ambulatory Visit: Payer: 59

## 2019-08-24 DIAGNOSIS — R4701 Aphasia: Secondary | ICD-10-CM

## 2019-08-24 DIAGNOSIS — R2689 Other abnormalities of gait and mobility: Secondary | ICD-10-CM | POA: Diagnosis not present

## 2019-08-24 NOTE — Patient Instructions (Signed)
  [place] - speech therapist
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
## 2019-08-30 ENCOUNTER — Telehealth: Payer: Self-pay

## 2019-08-30 NOTE — Telephone Encounter (Signed)
Received message from [employer] that PFO closure tomorrow has not been approved by [facility] and is not even under review yet for Dr. Burt Knack to do a peer to peer.  Called and spoke with the patient, who states he will call and discuss with his insurance. He requests a call back at 1700.   Spoke with Dr. Burt Knack. Since TEE is not denied (this was confirmed with precert department), TEE can be completed and PFO closure can be done later if the patient agrees.

## 2019-08-30 NOTE — Telephone Encounter (Signed)
L;ate entry from 1545: Received message from [employer] that PFO closure tomorrow has not been approved by [facility] and is not even under review yet for Dr. Burt Knack to do a peer to peer.  Called and spoke with the patient, who states he will call and discuss with his insurance. He requests a call back at 1700.  The patient called the office back around 1630 with [facility] on the line. Both were transferred to [facility] (precert dept).  Spoke with [place] after she got off the phone. She states [employer] will call her in the morning with a final decision.  Spoke with Dr. Burt Knack. Since TEE is not denied (this was confirmed with [place] [place]), TEE can be completed and PFO closure can be done later if necessary. Called and reviewed options with the patient. He agrees to have TEE and will wait to see if he needs PFO closure. We will keep procedures scheduled at this time in case closure is necessary and [employer] comes through and says procedure is covered.

## 2019-08-31 ENCOUNTER — Ambulatory Visit (HOSPITAL_COMMUNITY)
Admission: RE | Admit: 2019-08-31 | Discharge: 2019-08-31 | Disposition: A | Discharge status: Home / Self Care | Payer: 59 | Attending: Internal Medicine | Admitting: Internal Medicine

## 2019-08-31 ENCOUNTER — Encounter (HOSPITAL_COMMUNITY)
Admission: RE | Disposition: A | Discharge status: Home / Self Care | Payer: 59 | Source: Home / Self Care | Attending: Internal Medicine

## 2019-08-31 ENCOUNTER — Ambulatory Visit (HOSPITAL_COMMUNITY): Payer: 59 | Admitting: Certified Registered Nurse Anesthetist

## 2019-08-31 ENCOUNTER — Encounter (HOSPITAL_COMMUNITY): Payer: Self-pay | Admitting: Internal Medicine

## 2019-08-31 ENCOUNTER — Other Ambulatory Visit: Payer: Self-pay

## 2019-08-31 ENCOUNTER — Inpatient Hospital Stay: Payer: Self-pay | Admitting: Adult Health

## 2019-08-31 ENCOUNTER — Ambulatory Visit (HOSPITAL_BASED_OUTPATIENT_CLINIC_OR_DEPARTMENT_OTHER)
Admission: RE | Admit: 2019-08-31 | Discharge: 2019-08-31 | Disposition: A | Discharge status: Home / Self Care | Payer: 59 | Source: Ambulatory Visit | Attending: Cardiovascular Disease | Admitting: Cardiovascular Disease

## 2019-08-31 DIAGNOSIS — Z7902 Long term (current) use of antithrombotics/antiplatelets: Secondary | ICD-10-CM | POA: Diagnosis not present

## 2019-08-31 DIAGNOSIS — Z79899 Other long term (current) drug therapy: Secondary | ICD-10-CM | POA: Insufficient documentation

## 2019-08-31 DIAGNOSIS — Z7982 Long term (current) use of aspirin: Secondary | ICD-10-CM | POA: Diagnosis not present

## 2019-08-31 DIAGNOSIS — Q211 Atrial septal defect: Secondary | ICD-10-CM | POA: Diagnosis not present

## 2019-08-31 DIAGNOSIS — G4733 Obstructive sleep apnea (adult) (pediatric): Secondary | ICD-10-CM | POA: Diagnosis not present

## 2019-08-31 DIAGNOSIS — Q2112 Patent foramen ovale: Secondary | ICD-10-CM

## 2019-08-31 DIAGNOSIS — Z87891 Personal history of nicotine dependence: Secondary | ICD-10-CM | POA: Diagnosis not present

## 2019-08-31 HISTORY — PX: BUBBLE STUDY: SHX6837

## 2019-08-31 HISTORY — PX: TEE WITHOUT CARDIOVERSION: SHX5443

## 2019-08-31 SURGERY — PATENT FORAMEN OVALE (PFO) CLOSURE
Anesthesia: LOCAL

## 2019-08-31 SURGERY — ECHOCARDIOGRAM, TRANSESOPHAGEAL
Anesthesia: Monitor Anesthesia Care

## 2019-08-31 MED ORDER — SODIUM CHLORIDE 0.9 % IV SOLN: INTRAVENOUS | Status: DC

## 2019-08-31 MED ORDER — LIDOCAINE HCL (CARDIAC) PF 100 MG/5ML IV SOSY
PREFILLED_SYRINGE | INTRAVENOUS | Status: DC | PRN
  Administered 2019-08-31: 60 mg via INTRATRACHEAL

## 2019-08-31 MED ORDER — CLOPIDOGREL BISULFATE 75 MG PO TABS: 75.0000 mg | ORAL_TABLET | ORAL | Status: DC

## 2019-08-31 MED ORDER — PROPOFOL 10 MG/ML IV BOLUS
INTRAVENOUS | Status: DC | PRN
  Administered 2019-08-31: 30 mg via INTRAVENOUS
  Administered 2019-08-31 (×2): 10 mg via INTRAVENOUS

## 2019-08-31 MED ORDER — ASPIRIN 81 MG PO CHEW: 81.0000 mg | CHEWABLE_TABLET | ORAL | Status: DC

## 2019-08-31 MED ORDER — PROPOFOL 500 MG/50ML IV EMUL
INTRAVENOUS | Status: DC | PRN
  Administered 2019-08-31: 75 ug/kg/min via INTRAVENOUS

## 2019-08-31 NOTE — Telephone Encounter (Signed)
TEE was completed today but PFO closure is still pending [facility] approval.  The patient was discharged after reviewing options with Dr. Burt Knack. Will call and reschedule PFO closure once approval is obtained.

## 2019-08-31 NOTE — Progress Notes (Signed)
Pt transported to short stay until final decision for PFO closure is made. Procedural care complete at this time.

## 2019-08-31 NOTE — Progress Notes (Signed)
Patients PFO procedure to be rescheduled. Pt. Discharged from short stay after speaking with Dr. Burt Knack. Instructions reviewed post TEE. Verbalized understanding.

## 2019-08-31 NOTE — Transfer of Care (Signed)
Immediate Anesthesia Transfer of Care Note  Patient: Matthew Stanton  Procedure(s) Performed: TRANSESOPHAGEAL ECHOCARDIOGRAM (TEE) (N/A ) BUBBLE STUDY  Patient Location: Endoscopy Unit  Anesthesia Type:MAC  Level of Consciousness: awake  Airway & Oxygen Therapy: Patient Spontanous Breathing  Post-op Assessment: Report given to RN and Post -op Vital signs reviewed and stable  Post vital signs: Reviewed  Last Vitals:  Vitals Value Taken Time  BP 106/62 08/31/19 0924  Temp    Pulse 70 08/31/19 0926  Resp 15 08/31/19 0926  SpO2 100 % 08/31/19 0926  Vitals shown include unvalidated device data.  Last Pain:  Vitals:   08/31/19 0925  TempSrc:   PainSc: 0-No pain         Complications: No apparent anesthesia complications

## 2019-08-31 NOTE — H&P (Signed)
   INTERVAL PROCEDURE H&P  History and Physical Interval Note:  08/31/2019 8:33 AM  Matthew Stanton has presented today for their planned procedure. The various methods of treatment have been discussed with the patient and family. After consideration of risks, benefits and other options for treatment, the patient has consented to the procedure.  The patients' outpatient history has been reviewed, patient examined, and no change in status from most recent office note within the past 30 days. I have reviewed the patients' chart and labs and will proceed as planned. Questions were answered to the patient's satisfaction.   Pixie Casino, MD, [facility], [place] Director of the Advanced Lipid Disorders &  Cardiovascular Risk Reduction Clinic Diplomate of the American Board of Clinical Lipidology Attending Cardiologist  Direct Dial: [phone]  Fax: [phone]  Website:  [email or web address] 08/31/2019, 8:33 AM

## 2019-08-31 NOTE — CV Procedure (Signed)
TRANSESOPHAGEAL ECHOCARDIOGRAM (TEE) NOTE  INDICATIONS: crpytogenic stroke, suspected PFO  PROCEDURE:   Informed consent was obtained prior to the procedure. The risks, benefits and alternatives for the procedure were discussed and the patient comprehended these risks.  Risks include, but are not limited to, cough, sore throat, vomiting, nausea, somnolence, esophageal and stomach trauma or perforation, bleeding, low blood pressure, aspiration, pneumonia, infection, trauma to the teeth and death.    After a procedural time-out, the patient was given propofol per anesthesia for sedation.  The patient's heart rate, blood pressure, and oxygen saturation are monitored continuously during the procedure. The transesophageal probe was inserted in the esophagus and stomach without difficulty and multiple views were obtained.  The patient was kept under observation until the patient left the procedure room. The patient left the procedure room in stable condition.   Agitated microbubble saline contrast was administered.  COMPLICATIONS:    There were no immediate complications.  Findings:  1. LEFT VENTRICLE: The left ventricular wall thickness is normal.  The left ventricular cavity is normal in size. Wall motion is normal.  LVEF is 55-60%.  2. RIGHT VENTRICLE:  The right ventricle is normal in structure and function without any thrombus or masses.    3. LEFT ATRIUM:  The left atrium is normal in size without any thrombus or masses.  There is not spontaneous echo contrast ("smoke") in the left atrium consistent with a low flow state.  4. LEFT ATRIAL APPENDAGE:  The left atrial appendage is free of any thrombus or masses. The appendage has single lobes. Pulse doppler indicates moderate flow in the appendage.  5. ATRIAL SEPTUM:  The atrial septum is aneurysmal. There is bidirectional shunting by color doppler and moderate right to left shunting by saline microbubble contrast consistent with  moderate-sized PFO.  6. RIGHT ATRIUM:  The right atrium is normal in size and function without any thrombus or masses.  7. MITRAL VALVE:  The mitral valve is normal in structure and function with no regurgitation.  There were no vegetations or stenosis.  8. AORTIC VALVE:  The aortic valve is trileaflet, normal in structure and function with no regurgitation.  There were no vegetations or stenosis  9. TRICUSPID VALVE:  The tricuspid valve is normal in structure and function with no regurgitation.  There were no vegetations or stenosis  10.  PULMONIC VALVE:  The pulmonic valve is normal in structure and function with no regurgitation.  There were no vegetations or stenosis.   11. AORTIC ARCH, ASCENDING AND DESCENDING AORTA:  There was no Ron Parker et. Al, 1992) atherosclerosis of the ascending aorta, aortic arch, or proximal descending aorta.  12. PULMONARY VEINS: Anomalous pulmonary venous return was not noted.  13. PERICARDIUM: The pericardium appeared normal and non-thickened.  There is no pericardial effusion.  IMPRESSION:   1. Aneurysmal IAS. Moderate-sized PFO with sponataneous, bidirectional shunting. 2. No LAA thrombus 3. No valvular disease 4. Normal LVEF 55-60%, normal wall motion  RECOMMENDATIONS:    1. D/w Dr. Burt Knack, would recommend proceeding with PFO closure given recent stroke and moderate-sized PFO with bidirectional shunting.  Time Spent Directly with the Patient:  45 minutes   Pixie Casino, MD, [facility], [place] Director of the Advanced Lipid Disorders &  Cardiovascular Risk Reduction Clinic Diplomate of the American Board of Clinical Lipidology Attending Cardiologist  Direct Dial: [phone]  Fax: [phone]  Website:  [email or web address] 08/31/2019, 9:23 AM

## 2019-08-31 NOTE — Progress Notes (Signed)
Pt out of recovery. Awaiting insurance approval for possible PFO closure.

## 2019-08-31 NOTE — Progress Notes (Signed)
  Echocardiogram Echocardiogram Transesophageal has been performed.  Matthew Stanton 08/31/2019, 9:33 AM

## 2019-08-31 NOTE — Anesthesia Postprocedure Evaluation (Signed)
Anesthesia Post Note  Patient: Matthew Stanton  Procedure(s) Performed: TRANSESOPHAGEAL ECHOCARDIOGRAM (TEE) (N/A ) BUBBLE STUDY     Patient location during evaluation: PACU Anesthesia Type: MAC Level of consciousness: awake and alert Pain management: pain level controlled Vital Signs Assessment: post-procedure vital signs reviewed and stable Respiratory status: spontaneous breathing, nonlabored ventilation, respiratory function stable and patient connected to nasal cannula oxygen Cardiovascular status: stable and blood pressure returned to baseline Postop Assessment: no apparent nausea or vomiting Anesthetic complications: no    Last Vitals:  Vitals:   08/31/19 0935 08/31/19 0945  BP: (!) 104/57 110/78  Pulse: 62 66  Resp: 11 11  Temp:    SpO2: 100% 100%    Last Pain:  Vitals:   08/31/19 0945  TempSrc:   PainSc: 0-No pain                 Effie Berkshire

## 2019-08-31 NOTE — Anesthesia Preprocedure Evaluation (Addendum)
Anesthesia Evaluation  Patient identified by MRN, date of birth, ID band Patient awake    Reviewed: Allergy & Precautions, NPO status , Patient's Chart, lab work & pertinent test results  Airway Mallampati: III  TM Distance: >3 FB Neck ROM: Full    Dental no notable dental hx.    Pulmonary sleep apnea , former smoker,    Pulmonary exam normal        Cardiovascular hypertension, Pt. on medications  Rhythm:Regular Rate:Normal     Neuro/Psych CVA negative psych ROS   GI/Hepatic Neg liver ROS, GERD  Medicated,  Endo/Other  negative endocrine ROS  Renal/GU negative Renal ROS     Musculoskeletal negative musculoskeletal ROS (+)   Abdominal Normal abdominal exam  (+)   Peds  Hematology   Anesthesia Other Findings   Reproductive/Obstetrics                            Anesthesia Physical Anesthesia Plan  ASA: II  Anesthesia Plan: MAC   Post-op Pain Management:    Induction: Intravenous  PONV Risk Score and Plan: Propofol infusion  Airway Management Planned: Natural Airway and Nasal Cannula  Additional Equipment: None  Intra-op Plan:   Post-operative Plan:   Informed Consent: I have reviewed the patients History and Physical, chart, labs and discussed the procedure including the risks, benefits and alternatives for the proposed anesthesia with the patient or authorized representative who has indicated his/her understanding and acceptance.       Plan Discussed with: CRNA  Anesthesia Plan Comments:         Anesthesia Quick Evaluation

## 2019-08-31 NOTE — Anesthesia Procedure Notes (Signed)
Procedure Name: MAC Date/Time: 08/31/2019 9:01 AM Performed by: Janene Harvey, CRNA Pre-anesthesia Checklist: Patient identified, Emergency Drugs available, Suction available and Patient being monitored Oxygen Delivery Method: Simple face mask Dental Injury: Teeth and Oropharynx as per pre-operative assessment

## 2019-09-01 ENCOUNTER — Telehealth: Payer: Self-pay | Admitting: Cardiovascular Disease

## 2019-09-01 NOTE — Telephone Encounter (Signed)
I wrote a letter and sent through his my chart. Thank you!

## 2019-09-01 NOTE — Telephone Encounter (Signed)
Returned call to Pt.  Pt was able to pull up return to work letter on his [employer].  Per Pt he will print and take to work.  Pt states if he has any trouble printing he will contact office on Monday to pick it up.  No further action needed.

## 2019-09-01 NOTE — Telephone Encounter (Signed)
Patient calling requesting Dr. Burt Knack to give him a note for him to go back to work on 1/4.

## 2019-09-05 ENCOUNTER — Telehealth: Payer: Self-pay | Admitting: Cardiovascular Disease

## 2019-09-05 ENCOUNTER — Ambulatory Visit: Payer: 59 | Admitting: Nurse Practitioner

## 2019-09-05 DIAGNOSIS — Q211 Atrial septal defect: Secondary | ICD-10-CM

## 2019-09-05 DIAGNOSIS — Q2112 Patent foramen ovale: Secondary | ICD-10-CM

## 2019-09-05 NOTE — Telephone Encounter (Signed)
Cortez from [employer] is calling to say that the heart cath procedure has been approved.  Approval code: [id number]

## 2019-09-05 NOTE — Telephone Encounter (Signed)
I will send as FYI to both Dr. Antionette Char nurse Valetta Fuller as well as pre cert dept.

## 2019-09-07 NOTE — Telephone Encounter (Signed)
Scheduled the patient for PFO closure 1/20. Arrival time 0630 for 0830 case. He will labs labs drawn tomorrow in [place]. Virtual visit scheduled 1/13 with Nell Range to update H&P. No COVID screen will be scheduled since he had a positive result 11/18. The patient was grateful for assistance.        Virtual Visit Pre-Appointment Phone Call  Confirm consent - "In the setting of the current Covid19 crisis, you are scheduled for a (phone or video) visit with your provider on (date) at (time).  Just as we do with many in-office visits, in order for you to participate in this visit, we must obtain consent.  If you'd like, I can send this to your mychart (if signed up) or email for you to review.  Otherwise, I can obtain your verbal consent now.  All virtual visits are billed to your insurance company just like a normal visit would be.  By agreeing to a virtual visit, we'd like you to understand that the technology does not allow for your provider to perform an examination, and thus may limit your provider's ability to fully assess your condition. If your provider identifies any concerns that need to be evaluated in person, we will make arrangements to do so.  Finally, though the technology is pretty good, we cannot assure that it will always work on either your or our end, and in the setting of a video visit, we may have to convert it to a phone-only visit.  In either situation, we cannot ensure that we have a secure connection.  Are you willing to proceed?" STAFF: Did the patient verbally acknowledge consent to telehealth visit? Document YES/NO here: YES  TELEPHONE CALL NOTE  Matthew Stanton has been deemed a candidate for a follow-up tele-health visit to limit community exposure during the Covid-19 pandemic. I spoke with the patient via phone to ensure availability of phone/video source, confirm preferred email & phone number, and discuss instructions and expectations.  I reminded Matthew Stanton to be prepared with any vital sign and/or heart rhythm information that could potentially be obtained via home monitoring, at the time of his visit. I reminded Matthew Stanton to expect a phone call prior to his visit.

## 2019-09-07 NOTE — Telephone Encounter (Signed)
Follow Up:    Pt called and said insurance have approved his procedure and he would like to be scheduled please.

## 2019-09-08 ENCOUNTER — Other Ambulatory Visit: Payer: Self-pay

## 2019-09-08 ENCOUNTER — Ambulatory Visit: Payer: 59 | Admitting: Adult Health

## 2019-09-08 ENCOUNTER — Encounter: Payer: Self-pay | Admitting: Adult Health

## 2019-09-08 VITALS — BP 118/64 | HR 72 | Temp 97.5°F | Ht 67.0 in | Wt 186.5 lb

## 2019-09-08 DIAGNOSIS — I63311 Cerebral infarction due to thrombosis of right middle cerebral artery: Secondary | ICD-10-CM

## 2019-09-08 DIAGNOSIS — Q211 Atrial septal defect: Secondary | ICD-10-CM

## 2019-09-08 DIAGNOSIS — Q2112 Patent foramen ovale: Secondary | ICD-10-CM
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
## 2019-09-08 NOTE — Patient Instructions (Signed)
Continue aspirin 81 mg daily and clopidogrel 75 mg daily  and lipitor  for secondary stroke prevention  Continue to follow up with PCP regarding cholesterol and blood pressure management   Undergo PFO closure with Dr. Burt Knack at the end of this month -he will further determine duration of ongoing use of both aspirin and Plavix  Continue to monitor blood pressure at home  Maintain strict control of hypertension with blood pressure goal below 130/90, diabetes with hemoglobin A1c goal below 6.5% and cholesterol with LDL cholesterol (bad cholesterol) goal below 70 mg/dL. I also advised the patient to eat a healthy diet with plenty of whole grains, cereals, fruits and vegetables, exercise regularly and maintain ideal body weight.  Followup in the future with me in 6 months or call earlier if needed       Thank you for coming to see [place] at [facility]. I hope we have been able to provide you high quality care today.  You may receive a patient satisfaction survey over the next few weeks. We would appreciate your feedback and comments so that we may continue to improve ourselves and the health of our patients.

## 2019-09-09 LAB — BASIC METABOLIC PANEL
BUN/Creatinine Ratio: 12 (ref 9–20)
BUN: 12 mg/dL (ref 6–24)
CO2: 25 mmol/L (ref 20–29)
Calcium: 9.8 mg/dL (ref 8.7–10.2)
Chloride: 100 mmol/L (ref 96–106)
Creatinine, Ser: 0.98 mg/dL (ref 0.76–1.27)
GFR calc Af Amer: 106 mL/min/{1.73_m2} (ref >59)
GFR calc non Af Amer: 91 mL/min/{1.73_m2} (ref >59)
Glucose: 88 mg/dL (ref 65–99)
Potassium: 4.7 mmol/L (ref 3.5–5.2)
Sodium: 138 mmol/L (ref 134–144)

## 2019-09-09 LAB — CBC WITH DIFFERENTIAL/PLATELET
Basophils Absolute: 0 10*3/uL (ref 0.0–0.2)
Basos: 0 %
EOS (ABSOLUTE): 0.1 10*3/uL (ref 0.0–0.4)
Eos: 1 %
Hematocrit: 42.2 % (ref 37.5–51.0)
Hemoglobin: 14.3 g/dL (ref 13.0–17.7)
Immature Grans (Abs): 0 10*3/uL (ref 0.0–0.1)
Immature Granulocytes: 0 %
Lymphocytes Absolute: 2.4 10*3/uL (ref 0.7–3.1)
Lymphs: 35 %
MCH: 31.4 pg (ref 26.6–33.0)
MCHC: 33.9 g/dL (ref 31.5–35.7)
MCV: 93 fL (ref 79–97)
Monocytes Absolute: 0.7 10*3/uL (ref 0.1–0.9)
Monocytes: 11 %
Neutrophils Absolute: 3.5 10*3/uL (ref 1.4–7.0)
Neutrophils: 53 %
Platelets: 234 10*3/uL (ref 150–450)
RBC: 4.56 x10E6/uL (ref 4.14–5.80)
RDW: 12.6 % (ref 11.6–15.4)
WBC: 6.8 10*3/uL (ref 3.4–10.8)
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
## 2019-09-12 NOTE — Progress Notes (Signed)
I agree with the above plan 

## 2019-09-14 ENCOUNTER — Encounter: Payer: Self-pay | Admitting: Physician Assistant

## 2019-09-14 ENCOUNTER — Other Ambulatory Visit: Payer: Self-pay

## 2019-09-14 ENCOUNTER — Telehealth (INDEPENDENT_AMBULATORY_CARE_PROVIDER_SITE_OTHER): Payer: 59 | Admitting: Physician Assistant

## 2019-09-14 VITALS — Ht 67.0 in | Wt 186.0 lb

## 2019-09-14 DIAGNOSIS — Q211 Atrial septal defect: Secondary | ICD-10-CM | POA: Diagnosis not present

## 2019-09-14 DIAGNOSIS — Q2112 Patent foramen ovale: Secondary | ICD-10-CM

## 2019-09-14 DIAGNOSIS — I519 Heart disease, unspecified: Secondary | ICD-10-CM

## 2019-09-14 MED ORDER — CLOPIDOGREL BISULFATE 75 MG PO TABS: 75.0000 mg | ORAL_TABLET | Freq: Every day | ORAL | 0 refills | Status: DC

## 2019-09-14 NOTE — Patient Instructions (Addendum)
You are scheduled for a PFO CLOSURE on Wednesday, January 20 with Dr. Sherren Mocha.  1. Please arrive at the [facility] (Main Entrance A) at [facility]: [address] [place], [place] [address] at 6:30 AM (This time is two hours before your procedure to ensure your preparation). Free valet parking service is available. You are allowed ONE visitor to stay in the waiting room during your procedure. Both you and your visitor must wear masks. Special note: Every effort is made to have your procedure done on time. Please understand that emergencies sometimes delay scheduled procedures.  2. Diet: Do not eat solid foods after midnight.  You may have clear liquids until 5am upon the day of the procedure. Drink plenty of water the day before your procedure.  3. Medication instructions in preparation for your procedure:  1) MAKE SURE TO TAKE ASPIRIN 81 mg the morning of your procedure  2) MAKE SURE TO TAKE PLAVIX (clopidogrel) 75mg  the morning of your procedure  3) You may take your other medications as directed with sips of water  4. Plan for one night stay just in case it is necessary--bring personal belongings.  5. Bring a current list of your medications and current insurance cards. 6. You MUST have a responsible person to drive you home. 7. Someone MUST be with you the first 24 hours after you arrive home or your discharge will be delayed. 8. Please wear clothes that are easy to get on and off and wear slip-on shoes.  Thank you for allowing [place] to care for you!   -- [facility]

## 2019-09-20 ENCOUNTER — Telehealth: Payer: Self-pay | Admitting: *Deleted

## 2019-09-20 NOTE — Telephone Encounter (Signed)
Pt contacted pre-PFO closure  scheduled at [facility] for: Wednesday September 21, 2019 8:30 AM Verified arrival time and place: [facility]) at: 6:30 AM   No solid food after midnight prior to cath, clear liquids until 5 AM day of procedure. Contrast allergy: no   AM meds can be  taken pre-cath with sip of water including: ASA 81 mg Plavix 75 mg  Confirmed patient has responsible adult to drive home post procedure and observe 24 hours after arriving home: yes  Currently, due to Covid-19 pandemic, only one support person will be allowed with patient. Must be the same support person for that patient's entire stay and will be required to wear a mask. They will be asked to wait in the waiting room for the duration of the patient's stay.  Patients are required to wear a mask when they enter the hospital.      COVID-19 Pre-Screening Questions:  . In the past 7 to 10 days have you had a cough,  shortness of breath, headache, congestion, fever (100 or greater) body aches, chills, sore throat, or sudden loss of taste or sense of smell? no . Have you been around anyone with known Covid 19? no . Have you been around anyone who is awaiting Covid 19 test results in the past 7 to 10 days? no . Have you been around anyone who has been exposed to Covid 19, or has mentioned symptoms of Covid 19 within the past 7 to 10 days? no    I reviewed procedure/mask/visitor instructions, Covid-19 screening questions with patient, he verbalized understanding, thanked me for call.  07/20/19-[facility]-positive Coronavirus.

## 2019-09-21 ENCOUNTER — Other Ambulatory Visit: Payer: Self-pay

## 2019-09-21 ENCOUNTER — Encounter (HOSPITAL_COMMUNITY)
Admission: RE | Disposition: A | Discharge status: Home / Self Care | Payer: 59 | Source: Home / Self Care | Attending: Cardiovascular Disease

## 2019-09-21 ENCOUNTER — Ambulatory Visit (HOSPITAL_BASED_OUTPATIENT_CLINIC_OR_DEPARTMENT_OTHER): Payer: 59

## 2019-09-21 ENCOUNTER — Ambulatory Visit (HOSPITAL_COMMUNITY)
Admission: RE | Admit: 2019-09-21 | Discharge: 2019-09-21 | Disposition: A | Discharge status: Home / Self Care | Payer: 59 | Attending: Cardiovascular Disease | Admitting: Cardiovascular Disease

## 2019-09-21 DIAGNOSIS — Z8616 Personal history of COVID-19: Secondary | ICD-10-CM | POA: Diagnosis not present

## 2019-09-21 DIAGNOSIS — Q2112 Patent foramen ovale: Secondary | ICD-10-CM

## 2019-09-21 DIAGNOSIS — Z87891 Personal history of nicotine dependence: Secondary | ICD-10-CM | POA: Diagnosis not present

## 2019-09-21 DIAGNOSIS — Z7982 Long term (current) use of aspirin: Secondary | ICD-10-CM | POA: Insufficient documentation

## 2019-09-21 DIAGNOSIS — Z79899 Other long term (current) drug therapy: Secondary | ICD-10-CM | POA: Insufficient documentation

## 2019-09-21 DIAGNOSIS — G4733 Obstructive sleep apnea (adult) (pediatric): Secondary | ICD-10-CM | POA: Diagnosis not present

## 2019-09-21 DIAGNOSIS — Q211 Atrial septal defect: Secondary | ICD-10-CM

## 2019-09-21 HISTORY — PX: PATENT FORAMEN OVALE(PFO) CLOSURE: CATH118300

## 2019-09-21 LAB — ECHOCARDIOGRAM LIMITED
Height: 67 in
Weight: 2992 oz

## 2019-09-21 LAB — POCT ACTIVATED CLOTTING TIME
Activated Clotting Time: 230 seconds
Activated Clotting Time: 169 seconds

## 2019-09-21 SURGERY — PATENT FORAMEN OVALE (PFO) CLOSURE
Anesthesia: LOCAL

## 2019-09-21 MED ORDER — HEPARIN (PORCINE) IN NACL 1000-0.9 UT/500ML-% IV SOLN
INTRAVENOUS | Status: DC | PRN
  Administered 2019-09-21 (×3): 500 mL

## 2019-09-21 MED ORDER — LIDOCAINE HCL (PF) 1 % IJ SOLN
INTRAMUSCULAR | Status: DC | PRN
  Administered 2019-09-21: 20 mL

## 2019-09-21 MED ORDER — HYDRALAZINE HCL 20 MG/ML IJ SOLN: 10.0000 mg | INTRAMUSCULAR | Status: DC | PRN

## 2019-09-21 MED ORDER — MIDAZOLAM HCL 2 MG/2ML IJ SOLN
INTRAMUSCULAR | Status: AC
  Filled 2019-09-21: qty 2

## 2019-09-21 MED ORDER — HEPARIN (PORCINE) IN NACL 1000-0.9 UT/500ML-% IV SOLN
INTRAVENOUS | Status: AC
  Filled 2019-09-21: qty 1000

## 2019-09-21 MED ORDER — MIDAZOLAM HCL 2 MG/2ML IJ SOLN
INTRAMUSCULAR | Status: DC | PRN
  Administered 2019-09-21: 2 mg via INTRAVENOUS
  Administered 2019-09-21 (×2): 1 mg via INTRAVENOUS

## 2019-09-21 MED ORDER — SODIUM CHLORIDE 0.9 % IV SOLN
INTRAVENOUS | Status: AC | PRN
  Administered 2019-09-21: 10 mL/h via INTRAVENOUS

## 2019-09-21 MED ORDER — SODIUM CHLORIDE 0.9 % IV SOLN: 250.0000 mL | INTRAVENOUS | Status: DC | PRN

## 2019-09-21 MED ORDER — FENTANYL CITRATE (PF) 100 MCG/2ML IJ SOLN
INTRAMUSCULAR | Status: AC
  Filled 2019-09-21: qty 2

## 2019-09-21 MED ORDER — LIDOCAINE HCL (PF) 1 % IJ SOLN
INTRAMUSCULAR | Status: AC
  Filled 2019-09-21: qty 30

## 2019-09-21 MED ORDER — CEFAZOLIN SODIUM-DEXTROSE 2-4 GM/100ML-% IV SOLN
INTRAVENOUS | Status: AC
  Filled 2019-09-21: qty 100

## 2019-09-21 MED ORDER — SODIUM CHLORIDE 0.9 % IV SOLN: INTRAVENOUS | Status: AC

## 2019-09-21 MED ORDER — ASPIRIN 81 MG PO CHEW: 81.0000 mg | CHEWABLE_TABLET | ORAL | Status: DC

## 2019-09-21 MED ORDER — SODIUM CHLORIDE 0.9% FLUSH: 3.0000 mL | Freq: Two times a day (BID) | INTRAVENOUS | Status: DC

## 2019-09-21 MED ORDER — CEFAZOLIN SODIUM-DEXTROSE 2-4 GM/100ML-% IV SOLN
2.0000 g | INTRAVENOUS | Status: AC
  Administered 2019-09-21: 2 g via INTRAVENOUS

## 2019-09-21 MED ORDER — SODIUM CHLORIDE 0.9% FLUSH: 3.0000 mL | INTRAVENOUS | Status: DC | PRN

## 2019-09-21 MED ORDER — SODIUM CHLORIDE 0.9 % WEIGHT BASED INFUSION: 1.0000 mL/kg/h | INTRAVENOUS | Status: DC

## 2019-09-21 MED ORDER — ACETAMINOPHEN 325 MG PO TABS: 650.0000 mg | ORAL_TABLET | ORAL | Status: DC | PRN

## 2019-09-21 MED ORDER — SODIUM CHLORIDE 0.9 % WEIGHT BASED INFUSION
3.0000 mL/kg/h | INTRAVENOUS | Status: AC
  Administered 2019-09-21: 07:00:00 3 mL/kg/h via INTRAVENOUS

## 2019-09-21 MED ORDER — AMOXICILLIN 500 MG PO TABS: 2000.0000 mg | ORAL_TABLET | ORAL | 0 refills | Status: DC

## 2019-09-21 MED ORDER — HEPARIN SODIUM (PORCINE) 1000 UNIT/ML IJ SOLN
INTRAMUSCULAR | Status: AC
  Filled 2019-09-21: qty 1

## 2019-09-21 MED ORDER — HEPARIN SODIUM (PORCINE) 1000 UNIT/ML IJ SOLN
INTRAMUSCULAR | Status: DC | PRN
  Administered 2019-09-21: 7000 [IU] via INTRAVENOUS

## 2019-09-21 MED ORDER — ONDANSETRON HCL 4 MG/2ML IJ SOLN: 4.0000 mg | Freq: Four times a day (QID) | INTRAMUSCULAR | Status: DC | PRN

## 2019-09-21 MED ORDER — LABETALOL HCL 5 MG/ML IV SOLN: 10.0000 mg | INTRAVENOUS | Status: DC | PRN

## 2019-09-21 MED ORDER — FENTANYL CITRATE (PF) 100 MCG/2ML IJ SOLN
INTRAMUSCULAR | Status: DC | PRN
  Administered 2019-09-21 (×3): 25 ug via INTRAVENOUS

## 2019-09-21 MED ORDER — HEPARIN (PORCINE) IN NACL 1000-0.9 UT/500ML-% IV SOLN
INTRAVENOUS | Status: AC
  Filled 2019-09-21: qty 500

## 2019-09-21 MED ORDER — CLOPIDOGREL BISULFATE 75 MG PO TABS: 75.0000 mg | ORAL_TABLET | ORAL | Status: DC

## 2019-09-21 SURGICAL SUPPLY — 17 items
CATH ACUNAV REPROCESSED (CATHETERS) ×1 IMPLANT
CATH SUPER TORQUE PLUS 6F MPA1 (CATHETERS) ×1 IMPLANT
COVER SWIFTLINK CONNECTOR (BAG) ×1 IMPLANT
KIT HEART LEFT (KITS) ×2 IMPLANT
OCCLUDER AMPLATZER PFO 25MM (Prosthesis & Implant Heart) ×1 IMPLANT
PACK CARDIAC CATHETERIZATION (CUSTOM PROCEDURE TRAY) ×2 IMPLANT
PROTECTION STATION PRESSURIZED (MISCELLANEOUS) ×2
SHEATH INTROD W/O MIN 9FR 25CM (SHEATH) ×1 IMPLANT
SHEATH PINNACLE 8F 10CM (SHEATH) ×1 IMPLANT
SHEATH PROBE COVER 6X72 (BAG) ×1 IMPLANT
STATION PROTECTION PRESSURIZED (MISCELLANEOUS) IMPLANT
SYS DELIVER AMP TREVISIO 8FR (SHEATH) ×2
SYSTEM DELIVER AMP TREVIS 8FR (SHEATH) IMPLANT
TRANSDUCER W/STOPCOCK (MISCELLANEOUS) ×2 IMPLANT
TUBING CIL FLEX 10 FLL-RA (TUBING) ×2 IMPLANT
WIRE EMERALD 3MM-J .035X150CM (WIRE) ×1 IMPLANT
WIRE G VAS 035X260 STIFF (WIRE) ×1 IMPLANT

## 2019-09-21 NOTE — Progress Notes (Signed)
Ambulated to bathroom to void and in the hallway tol well.

## 2019-09-21 NOTE — Discharge Instructions (Signed)
You will require antibiotics prior to any dental work, including cleanings, for 6 months after your PFO closure. This is to protect the device from potentially getting infected from bacteria in your mouth entering your bloodstream. The medication has been called into your pharmacy on file. Please pick this up to have ready before any scheduled dental work. Instructions will be outlined on the bottle. The medication should be taken 1 hour prior to your dental appointment.   Groin Site Care Refer to this sheet in the next few weeks. These instructions provide you with information on caring for yourself after your procedure. Your caregiver may also give you more specific instructions. Your treatment has been planned according to current medical practices, but problems sometimes occur. Call your caregiver if you have any problems or questions after your procedure. HOME CARE INSTRUCTIONS  You may shower 24 hours after the procedure. Remove the bandage (dressing) and gently wash the site with plain soap and water. Gently pat the site dry.   Do not apply powder or lotion to the site.   Do not sit in a bathtub, swimming pool, or whirlpool for 5 to 7 days.   No bending, squatting, or lifting anything over 10 pounds (4.5 kg) for 1 week  Inspect the site at least twice daily.   Do not drive home if you are discharged the same day of the procedure. Have someone else drive you.   You may drive 24 hours after the procedure unless otherwise instructed by your caregiver.  What to expect:  Any bruising will usually fade within 1 to 2 weeks.   Blood that collects in the tissue (hematoma) may be painful to the touch. It should usually decrease in size and tenderness within 1 to 2 weeks.  SEEK IMMEDIATE MEDICAL CARE IF:  You have unusual pain at the groin site or down the affected leg.   You have redness, warmth, swelling, or pain at the groin site.   You have drainage (other than a small amount of blood  on the dressing).   You have chills.   You have a fever or persistent symptoms for more than 72 hours.   You have a fever and your symptoms suddenly get worse.   Your leg becomes pale, cool, tingly, or numb.  You have heavy bleeding from the site. Hold pressure on the site.   Femoral Site Care This sheet gives you information about how to care for yourself after your procedure. Your health care provider may also give you more specific instructions. If you have problems or questions, contact your health care provider. What can I expect after the procedure? After the procedure, it is common to have:  Bruising that usually fades within 1-2 weeks.  Tenderness at the site. Follow these instructions at home: Wound care  Follow instructions from your health care provider about how to take care of your insertion site. Make sure you: ? Wash your hands with soap and water before you change your bandage (dressing). If soap and water are not available, use hand sanitizer. ? Change your dressing as told by your health care provider. ? Leave stitches (sutures), skin glue, or adhesive strips in place. These skin closures may need to stay in place for 2 weeks or longer. If adhesive strip edges start to loosen and curl up, you may trim the loose edges. Do not remove adhesive strips completely unless your health care provider tells you to do that.  Do not take baths, swim,  or use a hot tub until your health care provider approves.  You may shower 24-48 hours after the procedure or as told by your health care provider. ? Gently wash the site with plain soap and water. ? Pat the area dry with a clean towel. ? Do not rub the site. This may cause bleeding.  Do not apply powder or lotion to the site. Keep the site clean and dry.  Check your femoral site every day for signs of infection. Check for: ? Redness, swelling, or pain. ? Fluid or blood. ? Warmth. ? Pus or a bad smell. Activity  For the  first 2-3 days after your procedure, or as long as directed: ? Avoid climbing stairs as much as possible. ? Do not squat.  Do not lift anything that is heavier than 10 lb (4.5 kg), or the limit that you are told, until your health care provider says that it is safe.  Rest as directed. ? Avoid sitting for a long time without moving. Get up to take short walks every 1-2 hours.  Do not drive for 24 hours if you were given a medicine to help you relax (sedative). General instructions  Take over-the-counter and prescription medicines only as told by your health care provider.  Keep all follow-up visits as told by your health care provider. This is important. Contact a health care provider if you have:  A fever or chills.  You have redness, swelling, or pain around your insertion site. Get help right away if:  The catheter insertion area swells very fast.  You pass out.  You suddenly start to sweat or your skin gets clammy.  The catheter insertion area is bleeding, and the bleeding does not stop when you hold steady pressure on the area.  The area near or just beyond the catheter insertion site becomes pale, cool, tingly, or numb. These symptoms may represent a serious problem that is an emergency. Do not wait to see if the symptoms will go away. Get medical help right away. Call your local emergency services (911 in the [place] Do not drive yourself to the hospital. Summary  After the procedure, it is common to have bruising that usually fades within 1-2 weeks.  Check your femoral site every day for signs of infection.  Do not lift anything that is heavier than 10 lb (4.5 kg), or the limit that you are told, until your health care provider says that it is safe. This information is not intended to replace advice given to you by your health care provider. Make sure you discuss any questions you have with your health care provider. Document Revised: 08/31/2017 Document Reviewed:  08/31/2017 Elsevier Patient Education  2020 [employer].

## 2019-09-21 NOTE — Progress Notes (Signed)
Dr. Burt Knack bedside.

## 2019-09-21 NOTE — Progress Notes (Signed)
Vascular in to do ECHO.

## 2019-09-21 NOTE — Interval H&P Note (Signed)
History and Physical Interval Note:  09/21/2019 8:31 AM  Matthew Stanton  has presented today for surgery, with the diagnosis of pfo.  The various methods of treatment have been discussed with the patient and family. After consideration of risks, benefits and other options for treatment, the patient has consented to  Procedure(s): PATENT FORAMEN OVALE (PFO) CLOSURE (N/A) as a surgical intervention.  The patient's history has been reviewed, patient examined, no change in status, stable for surgery.  I have reviewed the patient's chart and labs.  Questions were answered to the patient's satisfaction.    Moderate-large PFO by TEE. Plans for PFO closure have been reviewed at length with the patient.   Sherren Mocha

## 2019-09-21 NOTE — Progress Notes (Signed)
Site area- right  Site Prior to Removal- 0   Pressure Applied For-  20 MInutes   Bedrest Beginning at - 1030   Manual- Yes   Patient Status During Pull- Stable    Post Pull Groin Site- 0   Post Pull Instructions Given- Yes   Post Pull Pulses Present- Yes    Dressing Applied- Tegaderm and Gauze Dressing    Comments:

## 2019-09-21 NOTE — Progress Notes (Signed)
  Echocardiogram 2D Echocardiogram has been performed.  Darlina Sicilian M 09/21/2019, 12:49 PM

## 2019-10-12 ENCOUNTER — Other Ambulatory Visit: Payer: Self-pay

## 2019-10-12 NOTE — Patient Outreach (Signed)
Telephone outreach to patient to obtain mRS was successfully completed. MRS=0   [facility]
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
## 2019-10-20 ENCOUNTER — Ambulatory Visit: Payer: 59 | Admitting: Physician Assistant

## 2019-10-24 ENCOUNTER — Other Ambulatory Visit: Payer: Self-pay | Admitting: Physician Assistant

## 2019-10-24 ENCOUNTER — Other Ambulatory Visit: Payer: Self-pay

## 2019-10-24 ENCOUNTER — Telehealth (INDEPENDENT_AMBULATORY_CARE_PROVIDER_SITE_OTHER): Payer: 59 | Admitting: Physician Assistant

## 2019-10-24 DIAGNOSIS — Z8774 Personal history of (corrected) congenital malformations of heart and circulatory system: Secondary | ICD-10-CM

## 2019-10-24 MED ORDER — LOSARTAN POTASSIUM 25 MG PO TABS: 25.0000 mg | ORAL_TABLET | Freq: Every day | ORAL | 3 refills | Status: DC

## 2019-10-24 MED ORDER — ATORVASTATIN CALCIUM 40 MG PO TABS: 40.0000 mg | ORAL_TABLET | Freq: Every day | ORAL | 3 refills | Status: DC

## 2019-10-24 MED ORDER — CLOPIDOGREL BISULFATE 75 MG PO TABS: 75.0000 mg | ORAL_TABLET | Freq: Every day | ORAL | 0 refills | Status: DC

## 2019-10-24 NOTE — Patient Instructions (Addendum)
Stop plavix after 3 months (12/21/19). We will see you back in 1 year for echo with bubble and follow up. You must use antibiotics (amoxicillin) prior to dental work for 6 months after PFO closure. Please stay on a baby aspirin indefinitely and stay on all your other medications.

## 2019-12-27 ENCOUNTER — Telehealth: Payer: Self-pay | Admitting: Cardiovascular Disease

## 2019-12-27 NOTE — Telephone Encounter (Signed)
Read patient the prompt, he states he still wants to hear from Dr. Burt Knack or his nurse about whether or not it is safe for him to get the covid shot. He is aware that they are both off today.   We are recommending the COVID-19 vaccine to all of our patients. Cardiac medications (including blood thinners) should not deter anyone from being vaccinated and there is no need to hold any of those medications prior to vaccine administration.     Currently, there is a hotline to call (active 09/09/19) to schedule vaccination appointments as no walk-ins will be accepted.   Number: [phone].    If an appointment is not available please go to [email or web address] to sign up for notification when additional vaccine appointments are available.   If you have further questions or concerns about the vaccine process, please visit [email or web address] or contact your primary care physician.

## 2019-12-28 NOTE — Telephone Encounter (Signed)
Yes - I would recommend that he have the vaccine. I don't have any concerns about him getting vaccinated.   thx

## 2019-12-28 NOTE — Telephone Encounter (Signed)
Reiterated to patient that Dr. Burt Knack has no concerns about him being vaccinated and that he recommends the vaccine. He was grateful for assistance.   The patient is 3 months out from closure and has stopped his Plavix. Med list updated.

## 2020-02-09 ENCOUNTER — Ambulatory Visit: Payer: 59 | Admitting: Adult Health

## 2020-03-08 ENCOUNTER — Ambulatory Visit: Payer: 59 | Admitting: Adult Health

## 2020-03-13 ENCOUNTER — Other Ambulatory Visit: Payer: Self-pay

## 2020-03-13 ENCOUNTER — Ambulatory Visit: Payer: 59 | Admitting: Adult Health

## 2020-03-13 ENCOUNTER — Encounter: Payer: Self-pay | Admitting: Adult Health

## 2020-03-13 VITALS — BP 126/84 | HR 73 | Ht 67.0 in | Wt 182.2 lb

## 2020-03-13 DIAGNOSIS — Q2112 Patent foramen ovale: Secondary | ICD-10-CM

## 2020-03-13 DIAGNOSIS — I63311 Cerebral infarction due to thrombosis of right middle cerebral artery: Secondary | ICD-10-CM

## 2020-03-13 DIAGNOSIS — Q211 Atrial septal defect: Secondary | ICD-10-CM

## 2020-03-13 NOTE — Progress Notes (Signed)
I agree with the above plan 
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
## 2020-04-05 ENCOUNTER — Ambulatory Visit: Payer: 59 | Admitting: Internal Medicine

## 2020-05-16 ENCOUNTER — Encounter: Payer: Self-pay | Admitting: Internal Medicine

## 2020-05-16 ENCOUNTER — Other Ambulatory Visit: Payer: Self-pay

## 2020-05-16 ENCOUNTER — Ambulatory Visit (INDEPENDENT_AMBULATORY_CARE_PROVIDER_SITE_OTHER): Payer: 59 | Admitting: Internal Medicine

## 2020-05-16 DIAGNOSIS — I6602 Occlusion and stenosis of left middle cerebral artery: Secondary | ICD-10-CM

## 2020-05-16 DIAGNOSIS — U071 COVID-19: Secondary | ICD-10-CM

## 2020-05-16 DIAGNOSIS — G4733 Obstructive sleep apnea (adult) (pediatric): Secondary | ICD-10-CM | POA: Diagnosis not present

## 2020-05-16 NOTE — Assessment & Plan Note (Signed)
Has now had 2 Phizer Covax. He denies residual symptoms.

## 2020-05-16 NOTE — Assessment & Plan Note (Signed)
He benefits from CPAP with improved sleep and good compliance. Plan- continue CPAP 10. When eligible, anticipate replacement with auto 5-15

## 2020-05-16 NOTE — Patient Instructions (Signed)
We can continue CPAP 10  Plan on flu vaccine this Fall  Please call if we can help

## 2020-05-16 NOTE — Progress Notes (Signed)
HPI male former smoker followed for OSA NPSG 06/15/98    AHI 88/ hr, body weight 250 lbs  --------------------------------------------------------------------------  04/04/2019- 49 year old male former smoker followed for OSA CPAP 10/ [employer] Download compliance 100%, AHI 1.1/ hr Body weight today 184 lbs -----OSA on CPAP 10, DME: [employer], no complaints He watches his weight and has no major medical issues identified, pending routine f/u with his PCP tomorrow. Comfortable with his CPAP pressure and mask. Machine is about 48 years old.  He is maintaining Covid precautions. Denies other pressing health issues.   05/16/20- 49 year old male former smoker followed for OSA, complicated by embolic CVA, PFO closure, Covid infection Nov, 2020,  CPAP 10/ [employer] Download- compliance 93%, AHI 1.3/ hr Body weight today-185 lbs Covid vax- 2 Phizer -----osa, denies problems with cpap Embolic CVA treated with Plavix and closure of PFO. Now apparent complete resolution of neuro findings.  He is very comfortable and compliant with CPAP- discussed.  Will get flu vax at work.   ROS-see HPI + = positive Constitutional:   No-   weight loss, night sweats, fevers, chills, fatigue, lassitude. HEENT:   No-  headaches, difficulty swallowing, tooth/dental problems, sore throat,       No-  sneezing, itching, ear ache, nasal congestion, post nasal drip,  CV:  No-   chest pain, orthopnea, PND, swelling in lower extremities, anasarca,                      dizziness, palpitations Resp: No-   shortness of breath with exertion or at rest.              No-   productive cough,  No non-productive cough,  No- coughing up of blood.              No-   change in color of mucus.  No- wheezing.   Skin: No-   rash or lesions. GI:  No-   heartburn, indigestion, abdominal pain, nausea, vomiting,  GU: . MS:  No-   joint pain or swelling.   Neuro-     nothing unusual Psych:  No- change in mood or affect. No depression or anxiety.  No  memory loss.  OBJ- Physical Exam General- Alert, Oriented, Affect-appropriate, Distress- none acute, + not obese Skin- rash-none, lesions- none, excoriation- none Lymphadenopathy- none Head- atraumatic            Eyes- Gross vision intact, PERRLA, conjunctivae and secretions clear            Ears- Hearing, canals-normal            Nose- Clear, no-Septal dev, mucus, polyps, erosion, perforation             Throat- Mallampati III , mucosa clear , drainage- none, tonsils- atrophic Neck- flexible , trachea midline, no stridor , thyroid nl, carotid no bruit Chest - symmetrical excursion , unlabored           Heart/CV- RRR , no murmur , no gallop  , no rub, nl s1 s2                           - JVD- none , edema- none, stasis changes- none, varices- none           Lung- clear to P&A, wheeze- none, cough- none , dullness-none, rub- none           Chest wall-  Abd-  Br/ Gen/  Rectal- Not done, not indicated Extrem- cyanosis- none, clubbing, none, atrophy- none, strength- nl Neuro- grossly intact to observation

## 2020-05-16 NOTE — Assessment & Plan Note (Signed)
Embolic through PFO, now closed. Very lucky not to have significant residual. He continues to f/u with cardiology.

## 2020-07-12 ENCOUNTER — Encounter: Payer: Self-pay | Admitting: General Surgery

## 2020-07-12 ENCOUNTER — Ambulatory Visit (INDEPENDENT_AMBULATORY_CARE_PROVIDER_SITE_OTHER): Payer: 59 | Admitting: General Surgery

## 2020-07-12 ENCOUNTER — Other Ambulatory Visit: Payer: Self-pay

## 2020-07-12 VITALS — BP 132/80 | HR 70 | Temp 97.6°F | Resp 16 | Ht 67.0 in | Wt 186.0 lb

## 2020-07-12 DIAGNOSIS — D171 Benign lipomatous neoplasm of skin and subcutaneous tissue of trunk: Secondary | ICD-10-CM | POA: Diagnosis not present
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
## 2020-07-12 NOTE — Patient Instructions (Signed)
Lipoma  A lipoma is a noncancerous (benign) tumor that is made up of fat cells. This is a very common type of soft-tissue growth. Lipomas are usually found under the skin (subcutaneous). They may occur in any tissue of the body that contains fat. Common areas for lipomas to appear include the back, arms, shoulders, buttocks, and thighs. Lipomas grow slowly, and they are usually painless. Most lipomas do not cause problems and do not require treatment. What are the causes? The cause of this condition is not known. What increases the risk? You are more likely to develop this condition if:  You are 40-60 years old.  You have a family history of lipomas. What are the signs or symptoms? A lipoma usually appears as a small, round bump under the skin. In most cases, the lump will:  Feel soft or rubbery.  Not cause pain or other symptoms. However, if a lipoma is located in an area where it pushes on nerves, it can become painful or cause other symptoms. How is this diagnosed? A lipoma can usually be diagnosed with a physical exam. You may also have tests to confirm the diagnosis and to rule out other conditions. Tests may include:  Imaging tests, such as a CT scan or an MRI.  Removal of a tissue sample to be looked at under a microscope (biopsy). How is this treated? Treatment for this condition depends on the size of the lipoma and whether it is causing any symptoms.  For small lipomas that are not causing problems, no treatment is needed.  If a lipoma is bigger or it causes problems, surgery may be done to remove the lipoma. Lipomas can also be removed to improve appearance. Most often, the procedure is done after applying a medicine that numbs the area (local anesthetic).  Liposuction may be done to reduce the size of the lipoma before it is removed through surgery, or it may be done to remove the lipoma. Lipomas are removed with this method in order to limit incision size and scarring. A  liposuction tube is inserted through a small incision into the lipoma, and the contents of the lipoma are removed through the tube with suction. Follow these instructions at home:  Watch your lipoma for any changes.  Keep all follow-up visits as told by your health care provider. This is important. Contact a health care provider if:  Your lipoma becomes larger or hard.  Your lipoma becomes painful, red, or increasingly swollen. These could be signs of infection or a more serious condition. Get help right away if:  You develop tingling or numbness in an area near the lipoma. This could indicate that the lipoma is causing nerve damage. Summary  A lipoma is a noncancerous tumor that is made up of fat cells.  Most lipomas do not cause problems and do not require treatment.  If a lipoma is bigger or it causes problems, surgery may be done to remove the lipoma.  Contact a health care provider if your lipoma becomes larger or hard, or if it becomes painful, red, or increasingly swollen. Pain, redness, and swelling could be signs of infection or a more serious condition. This information is not intended to replace advice given to you by your health care provider. Make sure you discuss any questions you have with your health care provider. Document Revised: 04/04/2019 Document Reviewed: 04/04/2019 Elsevier Patient Education  2020 [employer]  

## 2020-08-07 ENCOUNTER — Other Ambulatory Visit: Payer: Self-pay | Admitting: Physician Assistant

## 2020-08-07 DIAGNOSIS — Z8774 Personal history of (corrected) congenital malformations of heart and circulatory system: Secondary | ICD-10-CM

## 2020-09-27 ENCOUNTER — Ambulatory Visit: Payer: 59 | Admitting: Physician Assistant

## 2020-09-27 ENCOUNTER — Ambulatory Visit (HOSPITAL_COMMUNITY): Payer: 59 | Attending: Internal Medicine

## 2020-09-27 ENCOUNTER — Other Ambulatory Visit: Payer: Self-pay

## 2020-09-27 ENCOUNTER — Encounter: Payer: Self-pay | Admitting: Physician Assistant

## 2020-09-27 VITALS — BP 112/64 | HR 66 | Ht 67.0 in | Wt 189.0 lb

## 2020-09-27 DIAGNOSIS — Z8673 Personal history of transient ischemic attack (TIA), and cerebral infarction without residual deficits: Secondary | ICD-10-CM

## 2020-09-27 DIAGNOSIS — Z8774 Personal history of (corrected) congenital malformations of heart and circulatory system: Secondary | ICD-10-CM

## 2020-09-27 DIAGNOSIS — I519 Heart disease, unspecified: Secondary | ICD-10-CM

## 2020-09-27 LAB — ECHOCARDIOGRAM COMPLETE BUBBLE STUDY
Area-P 1/2: 2.58 cm2
S' Lateral: 3.5 cm

## 2020-09-27 MED ORDER — SODIUM CHLORIDE 0.9% FLUSH
10.0000 mL | INTRAVENOUS | Status: AC | PRN
  Administered 2020-09-27: 10 mL via INTRAVENOUS

## 2020-09-27 MED ORDER — PERFLUTREN LIPID MICROSPHERE
1.0000 mL | INTRAVENOUS | Status: AC | PRN
  Administered 2020-09-27: 3 mL via INTRAVENOUS
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
## 2020-09-27 NOTE — Patient Instructions (Signed)
Medication Instructions:  1) STOP LOSARTAN *If you need a refill on your cardiac medications before your next appointment, please call your pharmacy*  Follow-Up: Your provider recommends that you schedule a follow-up appointment AS NEEDED!

## 2020-12-03 ENCOUNTER — Other Ambulatory Visit: Payer: Self-pay

## 2020-12-03 ENCOUNTER — Encounter: Payer: Self-pay | Admitting: Dermatology

## 2020-12-03 ENCOUNTER — Ambulatory Visit: Payer: 59 | Admitting: Dermatology

## 2020-12-03 DIAGNOSIS — L821 Other seborrheic keratosis: Secondary | ICD-10-CM | POA: Diagnosis not present

## 2020-12-03 DIAGNOSIS — Z1283 Encounter for screening for malignant neoplasm of skin: Secondary | ICD-10-CM | POA: Diagnosis not present

## 2020-12-15 ENCOUNTER — Encounter: Payer: Self-pay | Admitting: Dermatology

## 2020-12-15 NOTE — Progress Notes (Signed)
   New Patient   Subjective  Matthew Stanton is a 50 y.o. male who presents for the following: Annual Exam (No new concerns).  General skin examination Location:  Duration:  Quality:  Associated Signs/Symptoms: Modifying Factors:  Severity:  Timing: Context:    The following portions of the chart were reviewed this encounter and updated as appropriate:  Tobacco  Allergies  Meds  Problems  Med Hx  Surg Hx  Fam Hx      Objective  Well appearing patient in no apparent distress; mood and affect are within normal limits. Objective  Scalp: Full body skin check. No atypical moles, no skin cancer.   Objective  Left Forearm - Anterior, Right Forearm - Anterior: Multiple flattopped textured brown papules    A full examination was performed including scalp, head, eyes, ears, nose, lips, neck, chest, axillae, abdomen, back, buttocks, bilateral upper extremities, bilateral lower extremities, hands, feet, fingers, toes, fingernails, and toenails. All findings within normal limits unless otherwise noted below.   Assessment & Plan  Screening exam for skin cancer Scalp  Yearly skin check.  Encouraged to self examine twice annually.  Continued ultraviolet protection.  Seborrheic keratosis (2) Left Forearm - Anterior; Right Forearm - Anterior  Leave if stable.   Skin cancer screening performed today.

## 2021-01-30 ENCOUNTER — Other Ambulatory Visit: Payer: Self-pay | Admitting: Family Medicine

## 2021-01-30 DIAGNOSIS — K648 Other hemorrhoids: Secondary | ICD-10-CM

## 2021-01-30 DIAGNOSIS — Z1211 Encounter for screening for malignant neoplasm of colon: Secondary | ICD-10-CM

## 2021-02-14 ENCOUNTER — Ambulatory Visit: Payer: 59 | Admitting: General Surgery

## 2021-02-21 ENCOUNTER — Ambulatory Visit: Payer: 59 | Admitting: General Surgery

## 2021-02-21 ENCOUNTER — Encounter: Payer: Self-pay | Admitting: General Surgery

## 2021-02-21 ENCOUNTER — Other Ambulatory Visit: Payer: Self-pay

## 2021-02-21 VITALS — BP 113/72 | HR 69 | Temp 98.3°F | Resp 12 | Ht 67.0 in | Wt 190.0 lb

## 2021-02-21 DIAGNOSIS — Z01818 Encounter for other preprocedural examination: Secondary | ICD-10-CM | POA: Diagnosis not present

## 2021-02-21 DIAGNOSIS — Z1211 Encounter for screening for malignant neoplasm of colon: Secondary | ICD-10-CM | POA: Diagnosis not present

## 2021-02-21 MED ORDER — SUTAB 1479-225-188 MG PO TABS: 1.0000 | ORAL_TABLET | Freq: Once | ORAL | 0 refills | Status: AC
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
## 2021-03-12 ENCOUNTER — Encounter (HOSPITAL_COMMUNITY)
Admission: RE | Disposition: A | Discharge status: Home / Self Care | Payer: Self-pay | Source: Home / Self Care | Attending: General Surgery

## 2021-03-12 ENCOUNTER — Other Ambulatory Visit: Payer: Self-pay

## 2021-03-12 ENCOUNTER — Encounter (HOSPITAL_COMMUNITY): Payer: Self-pay | Admitting: General Surgery

## 2021-03-12 ENCOUNTER — Ambulatory Visit (HOSPITAL_COMMUNITY)
Admission: RE | Admit: 2021-03-12 | Discharge: 2021-03-12 | Disposition: A | Discharge status: Home / Self Care | Payer: 59 | Attending: General Surgery | Admitting: General Surgery

## 2021-03-12 DIAGNOSIS — Z79899 Other long term (current) drug therapy: Secondary | ICD-10-CM | POA: Diagnosis not present

## 2021-03-12 DIAGNOSIS — Z1211 Encounter for screening for malignant neoplasm of colon: Secondary | ICD-10-CM | POA: Insufficient documentation

## 2021-03-12 DIAGNOSIS — G4733 Obstructive sleep apnea (adult) (pediatric): Secondary | ICD-10-CM | POA: Insufficient documentation

## 2021-03-12 DIAGNOSIS — Z8616 Personal history of COVID-19: Secondary | ICD-10-CM | POA: Insufficient documentation

## 2021-03-12 DIAGNOSIS — Z800 Family history of malignant neoplasm of digestive organs: Secondary | ICD-10-CM | POA: Diagnosis not present

## 2021-03-12 DIAGNOSIS — Z7982 Long term (current) use of aspirin: Secondary | ICD-10-CM | POA: Diagnosis not present

## 2021-03-12 HISTORY — PX: COLONOSCOPY: SHX5424

## 2021-03-12 SURGERY — COLONOSCOPY
Anesthesia: Moderate Sedation

## 2021-03-12 MED ORDER — MEPERIDINE HCL 50 MG/ML IJ SOLN
INTRAMUSCULAR | Status: AC
  Filled 2021-03-12: qty 1

## 2021-03-12 MED ORDER — STERILE WATER FOR IRRIGATION IR SOLN
Status: DC | PRN
  Administered 2021-03-12: 100 mL

## 2021-03-12 MED ORDER — MIDAZOLAM HCL 5 MG/5ML IJ SOLN
INTRAMUSCULAR | Status: DC | PRN
  Administered 2021-03-12: 3 mg via INTRAVENOUS
  Administered 2021-03-12: 1 mg via INTRAVENOUS

## 2021-03-12 MED ORDER — MIDAZOLAM HCL 5 MG/5ML IJ SOLN
INTRAMUSCULAR | Status: AC
  Filled 2021-03-12: qty 10

## 2021-03-12 MED ORDER — MEPERIDINE HCL 50 MG/ML IJ SOLN
INTRAMUSCULAR | Status: DC | PRN
  Administered 2021-03-12: 50 mg via INTRAVENOUS

## 2021-03-12 MED ORDER — SODIUM CHLORIDE 0.9 % IV SOLN: INTRAVENOUS | Status: DC

## 2021-03-12 NOTE — Interval H&P Note (Signed)
History and Physical Interval Note:  03/12/2021 7:29 AM  Matthew Stanton  has presented today for surgery, with the diagnosis of Screening Z12.11.  The various methods of treatment have been discussed with the patient and family. After consideration of risks, benefits and other options for treatment, the patient has consented to  Procedure(s): COLONOSCOPY (N/A) as a surgical intervention.  The patient's history has been reviewed, patient examined, no change in status, stable for surgery.  I have reviewed the patient's chart and labs.  Questions were answered to the patient's satisfaction.     Aviva Signs

## 2021-03-12 NOTE — Op Note (Signed)
[facility] Patient Name: Matthew Stanton Procedure Date: 03/12/2021 7:10 AM MRN: [id number] Date of Birth: July 16, 1971 Attending MD: Aviva Signs , MD CSN: [id number] Age: 50 Admit Type: Outpatient Procedure:                Colonoscopy Indications:              Screening in patient at increased risk: Family                            history of 1st-degree relative with colorectal                            cancer Providers:                Aviva Signs, MD, Tammy Vaught, RN, Kristine L.                            Risa Grill, Technician Referring MD:              Medicines:                Midazolam 4 mg IV, Meperidine 50 mg IV Complications:            No immediate complications. Estimated Blood Loss:     Estimated blood loss: none. Procedure:                Pre-Anesthesia Assessment:                           - Prior to the procedure, a History and Physical                            was performed, and patient medications and                            allergies were reviewed. The patient is competent.                            The risks and benefits of the procedure and the                            sedation options and risks were discussed with the                            patient. All questions were answered and informed                            consent was obtained. Patient identification and                            proposed procedure were verified by the physician,                            the nurse and the technician in the endoscopy  suite. Mental Status Examination: normal. Airway                            Examination: normal oropharyngeal airway and neck                            mobility. Respiratory Examination: clear to                            auscultation. CV Examination: RRR, no murmurs, no                            S3 or S4. Prophylactic Antibiotics: The patient                            does not require prophylactic  antibiotics. Prior                            Anticoagulants: The patient has taken no previous                            anticoagulant or antiplatelet agents except for                            aspirin. ASA Grade Assessment: II - A patient with                            mild systemic disease. After reviewing the risks                            and benefits, the patient was deemed in                            satisfactory condition to undergo the procedure.                            The anesthesia plan was to use moderate sedation /                            analgesia (conscious sedation). Immediately prior                            to administration of medications, the patient was                            re-assessed for adequacy to receive sedatives. The                            heart rate, respiratory rate, oxygen saturations,                            blood pressure, adequacy of pulmonary ventilation,  and response to care were monitored throughout the                            procedure. The physical status of the patient was                            re-assessed after the procedure.                           After obtaining informed consent, the colonoscope                            was passed under direct vision. Throughout the                            procedure, the patient's blood pressure, pulse, and                            oxygen saturations were monitored continuously. The                            PCF-[id number] ([id number]) was introduced through the                            anus and advanced to the the cecum, identified by                            the appendiceal orifice, ileocecal valve and                            palpation. No anatomical landmarks were                            photographed. The entire colon was well visualized.                            The colonoscopy was performed without difficulty.                             The quality of the bowel preparation was adequate.                            The total duration of the procedure was 16 minutes. Scope In: 7:34:50 AM Scope Out: 7:47:05 AM Scope Withdrawal Time: 0 hours 7 minutes 26 seconds  Total Procedure Duration: 0 hours 12 minutes 15 seconds  Findings:      The perianal and digital rectal examinations were normal.      The entire examined colon appeared normal on direct and retroflexion       views. Impression:               - The entire examined colon is normal on direct and                            retroflexion views.                           -  No specimens collected. Moderate Sedation:      Moderate (conscious) sedation was administered by the endoscopy nurse       and supervised by the endoscopist. The patient's oxygen saturation,       heart rate, blood pressure and response to care were monitored. Recommendation:           - Written discharge instructions were provided to                            the patient.                           - The signs and symptoms of potential delayed                            complications were discussed with the patient.                           - Patient has a contact number available for                            emergencies.                           - Return to normal activities tomorrow.                           - Resume previous diet.                           - Continue present medications.                           - Repeat colonoscopy in 5 years for screening                            purposes. Procedure Code(s):        --- Professional ---                           [phone], Colonoscopy, flexible; diagnostic, including                            collection of specimen(s) by brushing or washing,                            when performed (separate procedure) Diagnosis Code(s):        --- Professional ---                           Z80.0, Family history of malignant neoplasm of                             digestive organs CPT copyright 2019 [employer]. All rights reserved. The codes documented in this report are preliminary and upon coder review may  be revised to meet current compliance requirements. Aviva Signs, MD Aviva Signs, MD 03/12/2021  7:56:12 AM This report has been signed electronically. Number of Addenda: 0

## 2021-03-18 ENCOUNTER — Encounter (HOSPITAL_COMMUNITY): Payer: Self-pay | Admitting: General Surgery

## 2021-05-15 NOTE — Progress Notes (Signed)
HPI male former smoker followed for OSA, complicated by emolic CVA, PFO closure, Covid infection Nov, 2020,  NPSG 06/15/98    AHI 88/ hr, body weight 250 lbs  --------------------------------------------------------------------------   05/16/20- 50 year old male former smoker followed for OSA, complicated by embolic CVA, PFO closure, Covid infection Nov, 2020,  CPAP 10/ [employer] Download- compliance 93%, AHI 1.3/ hr Body weight today-185 lbs Covid vax- 2 Phizer -----osa, denies problems with cpap Embolic CVA treated with Plavix and closure of PFO. Now apparent complete resolution of neuro findings.  He is very comfortable and compliant with CPAP- discussed.  Will get flu vax at work.   05/18/21- 62 year-old male former smoker followed for OSA, complicated by embolic CVA, PFO closure, Covid infection Nov, 2020,  CPAP 10/ [facility]- didn't bring card Body weight today-193 lbs Covid vax-3 Phizer  Download reviewed. Reports doing weell with CPAP- no concerns. Gets flu vax at PCP. Denies interval medical concerns.   ROS-see HPI + = positive Constitutional:   No-   weight loss, night sweats, fevers, chills, fatigue, lassitude. HEENT:   No-  headaches, difficulty swallowing, tooth/dental problems, sore throat,       No-  sneezing, itching, ear ache, nasal congestion, post nasal drip,  CV:  No-   chest pain, orthopnea, PND, swelling in lower extremities, anasarca,                       dizziness, palpitations Resp: No-   shortness of breath with exertion or at rest.              No-   productive cough,  No non-productive cough,  No- coughing up of blood.              No-   change in color of mucus.  No- wheezing.   Skin: No-   rash or lesions. GI:  No-   heartburn, indigestion, abdominal pain, nausea, vomiting,  GU: . MS:  No-   joint pain or swelling.   Neuro-     nothing unusual Psych:  No- change in mood or affect. No depression or anxiety.  No memory loss.  OBJ- Physical  Exam General- Alert, Oriented, Affect-appropriate, Distress- none acute, + not obese Skin- rash-none, lesions- none, excoriation- none Lymphadenopathy- none Head- atraumatic            Eyes- Gross vision intact, PERRLA, conjunctivae and secretions clear            Ears- Hearing, canals-normal            Nose- Clear, no-Septal dev, mucus, polyps, erosion, perforation             Throat- Mallampati III , mucosa clear , drainage- none, tonsils- atrophic Neck- flexible , trachea midline, no stridor , thyroid nl, carotid no bruit Chest - symmetrical excursion , unlabored           Heart/CV- RRR , no murmur , no gallop  , no rub, nl s1 s2                           - JVD- none , edema- none, stasis changes- none, varices- none           Lung- clear to P&A, wheeze- none, cough- none , dullness-none, rub- none           Chest wall-  Abd-  Br/ Gen/ Rectal- Not done, not indicated Extrem-  cyanosis- none, clubbing, none, atrophy- none, strength- nl Neuro- grossly intact to observation

## 2021-05-16 ENCOUNTER — Encounter: Payer: Self-pay | Admitting: Internal Medicine

## 2021-05-16 ENCOUNTER — Other Ambulatory Visit: Payer: Self-pay

## 2021-05-16 ENCOUNTER — Ambulatory Visit: Payer: 59 | Admitting: Internal Medicine

## 2021-05-16 VITALS — BP 116/74 | HR 80 | Temp 97.3°F | Ht 67.0 in | Wt 193.6 lb

## 2021-05-16 DIAGNOSIS — G4733 Obstructive sleep apnea (adult) (pediatric): Secondary | ICD-10-CM | POA: Diagnosis not present

## 2021-05-16 DIAGNOSIS — U071 COVID-19: Secondary | ICD-10-CM | POA: Diagnosis not present

## 2021-05-16 NOTE — Patient Instructions (Signed)
Order- DME [facility]- please replace old CPAP machine auto 5-15, mask of choice, humidifier, supplies, airview/ card  Please call if we can help

## 2021-05-25 NOTE — Assessment & Plan Note (Signed)
Benefits with good compliance and control. Plan- replace old machine auto 5-15

## 2021-05-25 NOTE — Assessment & Plan Note (Signed)
Denies residual after infection.

## 2021-06-12 ENCOUNTER — Other Ambulatory Visit (HOSPITAL_COMMUNITY): Payer: Self-pay | Admitting: Orthopedic Surgery

## 2021-06-12 DIAGNOSIS — S43431A Superior glenoid labrum lesion of right shoulder, initial encounter: Secondary | ICD-10-CM

## 2021-06-26 ENCOUNTER — Other Ambulatory Visit: Payer: Self-pay

## 2021-06-26 ENCOUNTER — Ambulatory Visit (HOSPITAL_COMMUNITY)
Admission: RE | Admit: 2021-06-26 | Discharge: 2021-06-26 | Disposition: A | Discharge status: Home / Self Care | Payer: 59 | Source: Ambulatory Visit | Attending: Orthopedic Surgery | Admitting: Orthopedic Surgery

## 2021-06-26 DIAGNOSIS — S43431A Superior glenoid labrum lesion of right shoulder, initial encounter: Secondary | ICD-10-CM

## 2021-06-26 DIAGNOSIS — M25511 Pain in right shoulder: Secondary | ICD-10-CM | POA: Diagnosis present

## 2021-06-26 DIAGNOSIS — X58XXXA Exposure to other specified factors, initial encounter: Secondary | ICD-10-CM | POA: Diagnosis not present

## 2021-06-26 DIAGNOSIS — M75101 Unspecified rotator cuff tear or rupture of right shoulder, not specified as traumatic: Secondary | ICD-10-CM | POA: Diagnosis not present

## 2021-06-26 IMAGING — RF DG FLUORO GUIDE NDL PLC/BX
3 series · 5 of 5 positions shown · non-contrast
Comparison: None.

CLINICAL DATA: Rotator cuff tear

EXAM:
RIGHT SHOULDER INJECTION UNDER FLUOROSCOPY

[Series 1: cp_standard · 0.09mm/px · 1 of 1 slices shown (1 of 3)]
[im 1/1]
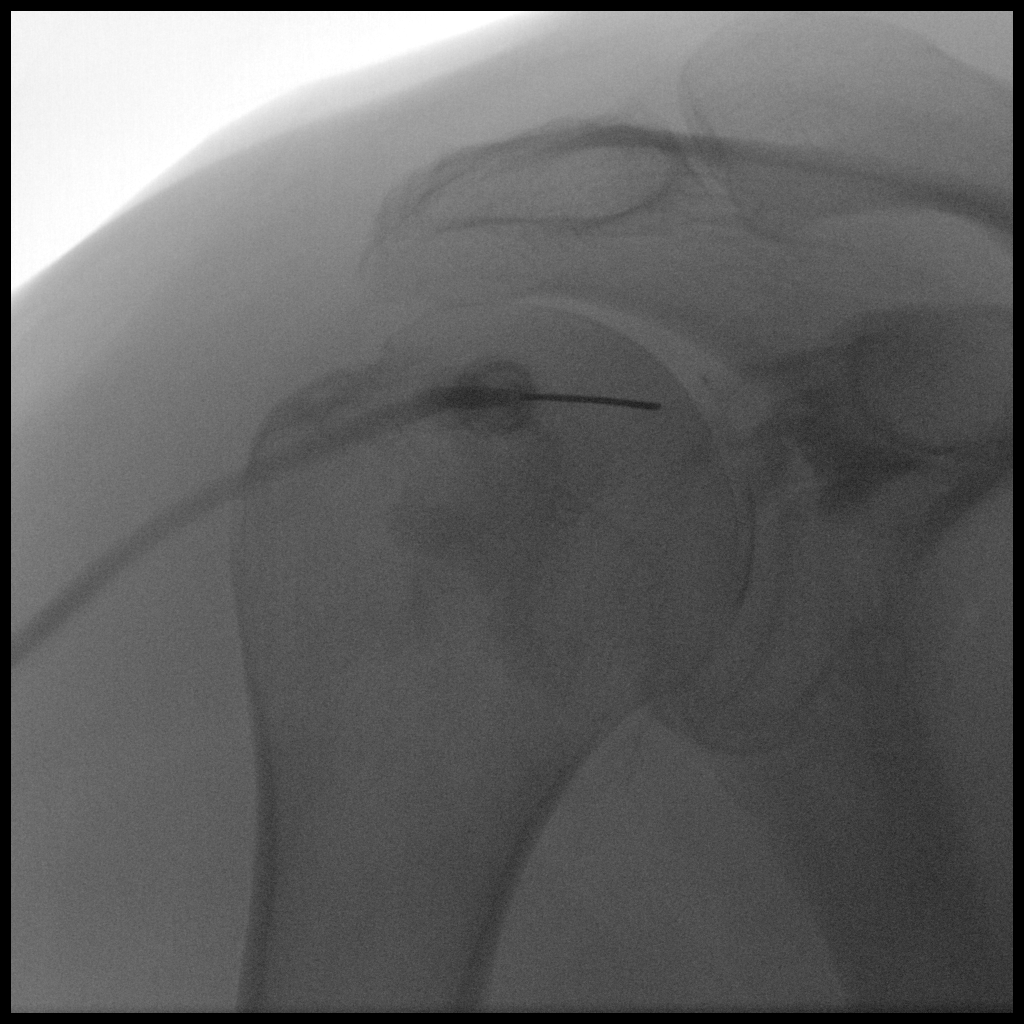

[Series 2: cp_standard · 0.09mm/px · 1 of 1 slices shown (2 of 3)]
[im 1/1]
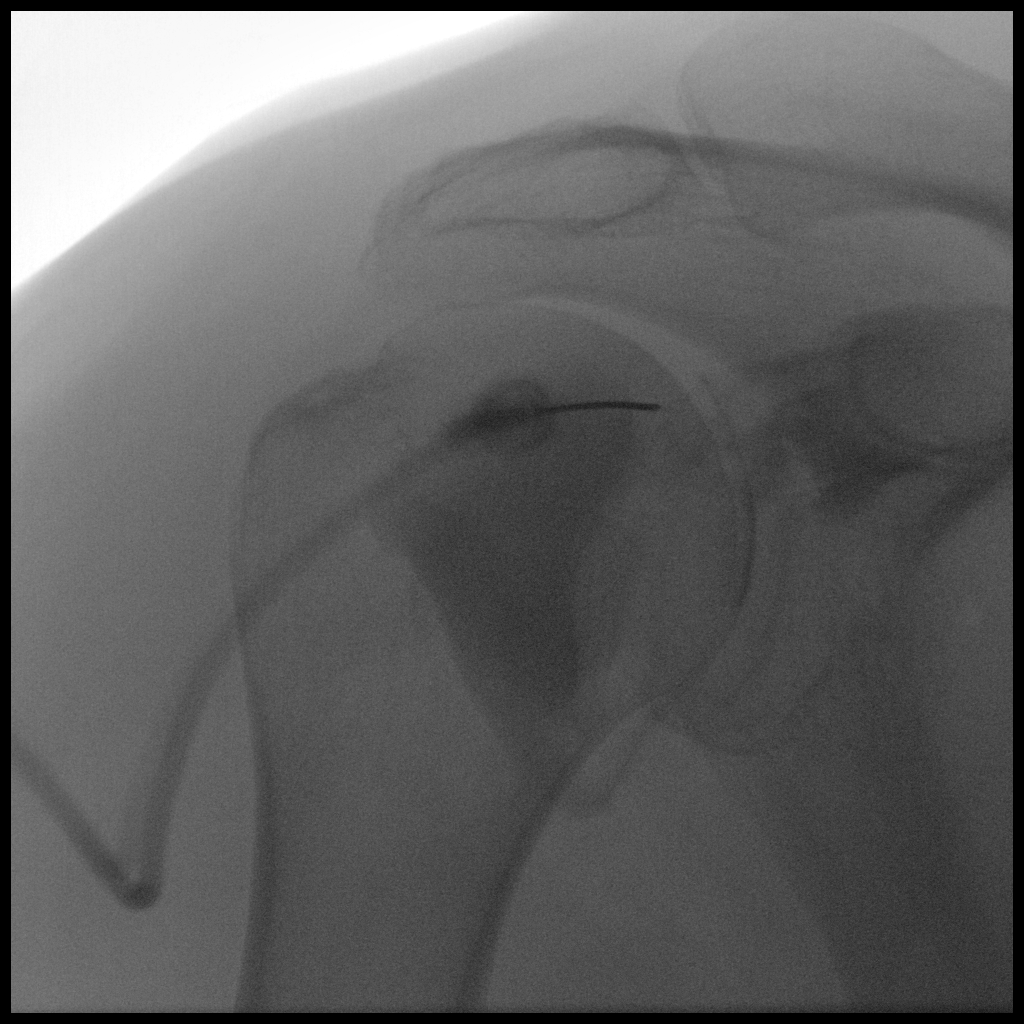

[Series 3: cp_standard · 0.18mm/px · 3 of 5 frames shown (3 of 3)]
[frame 1/5]
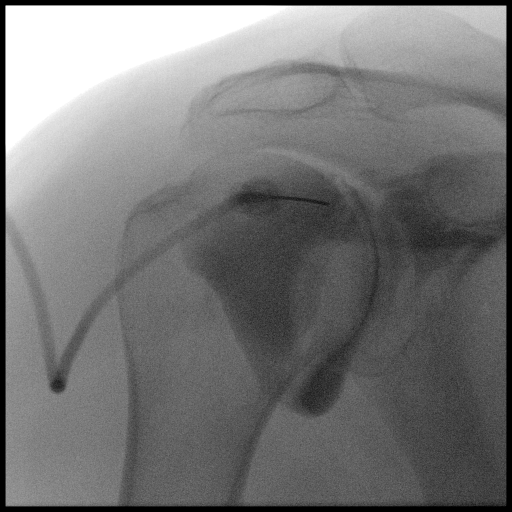
[frame 3/5]
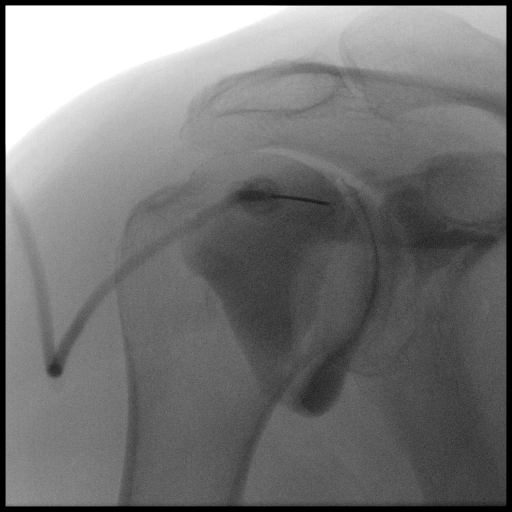
[frame 5/5]
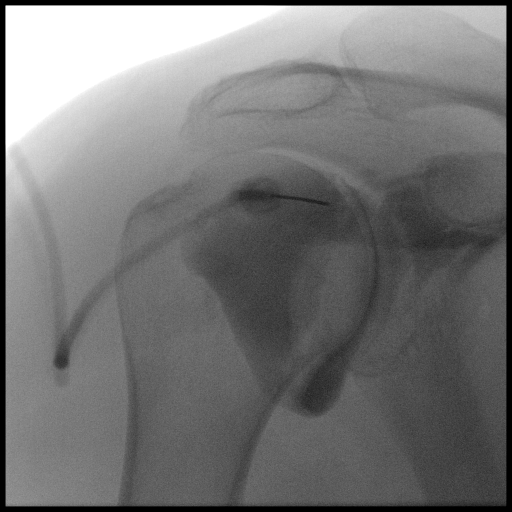

[5 of 5 positions shown; findings below may reference images not displayed]

FLUOROSCOPY TIME:  Fluoroscopy Time:  24 seconds

Radiation Exposure Index (if provided by the fluoroscopic device):
2.2 mGy

Number of Acquired Spot Images: 3 images

PROCEDURE:
Informed consent was obtained.

Overlying skin prepped with Betadine, draped in the usual sterile
fashion, and infiltrated locally with lidocaine for local
anesthesia. A 22 gauge spinal needle was advanced to the
superomedial margin of the right humeral head.

A test injection under fluoroscopy confirmed intra-articular
position. A mixture of 12 mL of Omnipaque 180, 8 mL of 0.9% saline,
and 0.05 mL Gadavist was injected into the right shoulder joint. A
total of 12 mL was injected into the joint.

The patient tolerated the procedure well. There were no immediate
complications.
IMPRESSION: Successful right shoulder injection under fluoroscopy for MR
arthrogram.

Contributor: NOWSHAD, NP

## 2021-06-26 IMAGING — MR MR SHOULDER*R* W/CM
4 of 5 series · 19 of 40 positions shown · IV contrast (2ml mh)
Comparison: None.

CLINICAL DATA: Shoulder pain

EXAM:
MR ARTHROGRAM OF THE RIGHT SHOULDER
TECHNIQUE: Multiplanar, multisequence MR imaging of the right shoulder was
performed following the administration of intra-articular contrast.
CONTRAST:  See Injection Documentation.

[Series 3: T1 fat-sat · axial · 3.0mm · 0.23mm/px · z∈[-74,-2]mm · 3 of 30 slices shown (1 of 2)]
[im 4/30]
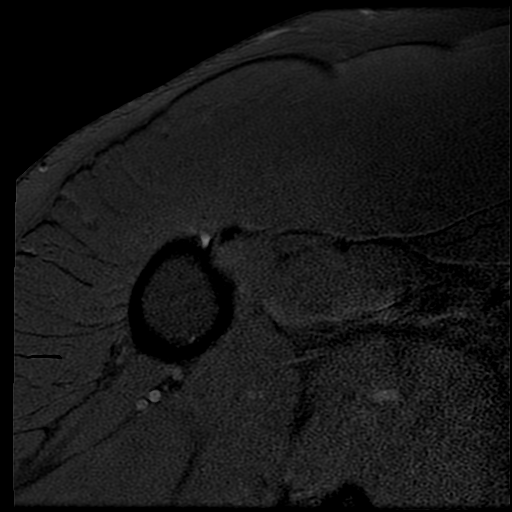
[im 17/30]
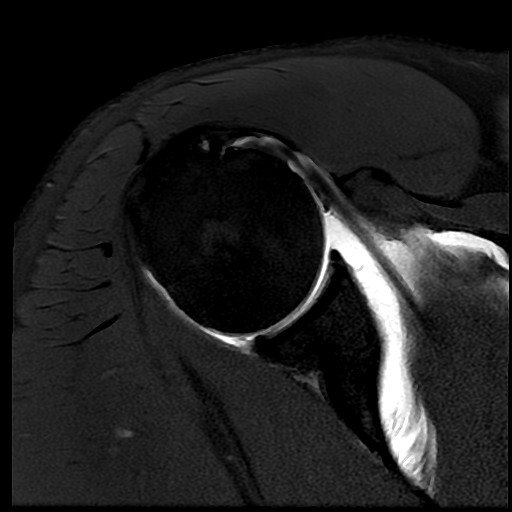
[im 26/30]
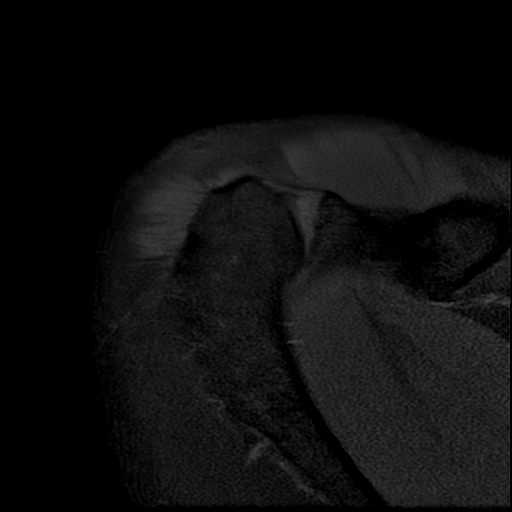

[Series 5: T1 fat-sat · oblique · 3.0mm · 0.27mm/px · 3 of 20 slices shown (2 of 2)]
[im 4/20]
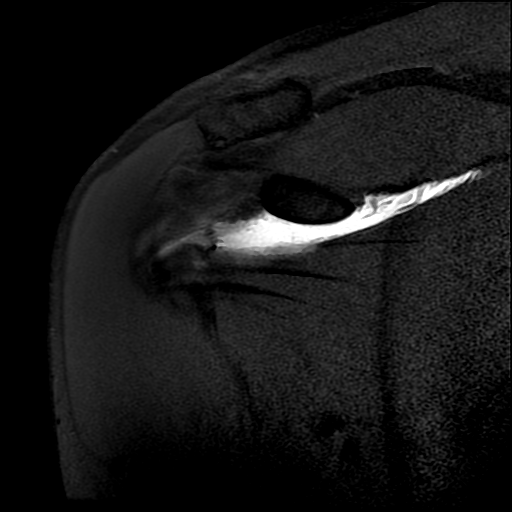
[im 10/20]
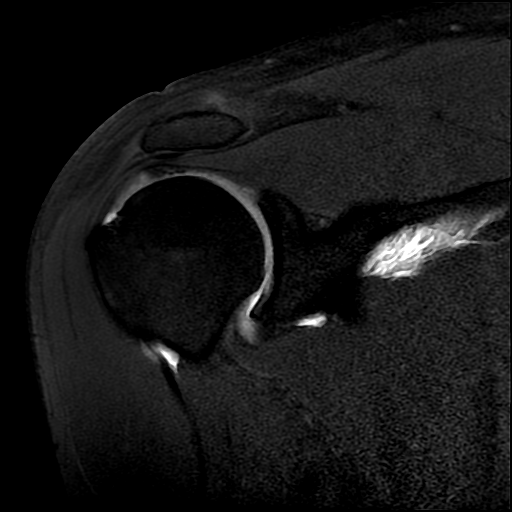
[im 16/20]
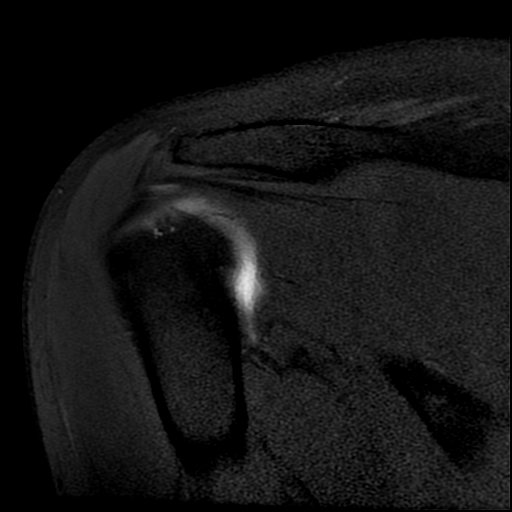

[Series 7: T2 fat-sat · oblique · 3.0mm · 0.27mm/px · 7 of 20 slices shown (1 of 2)]
[im 1/20]
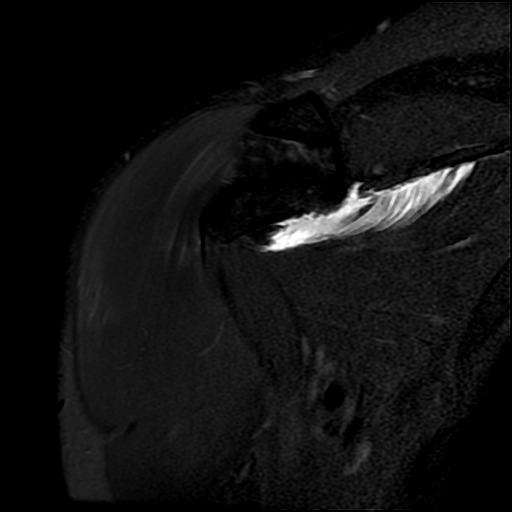
[im 4/20]
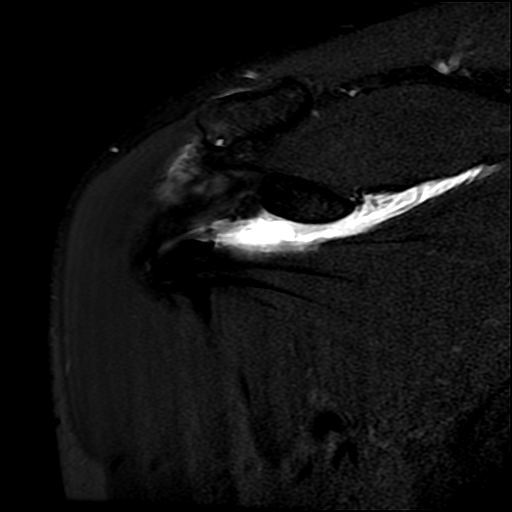
[im 7/20]
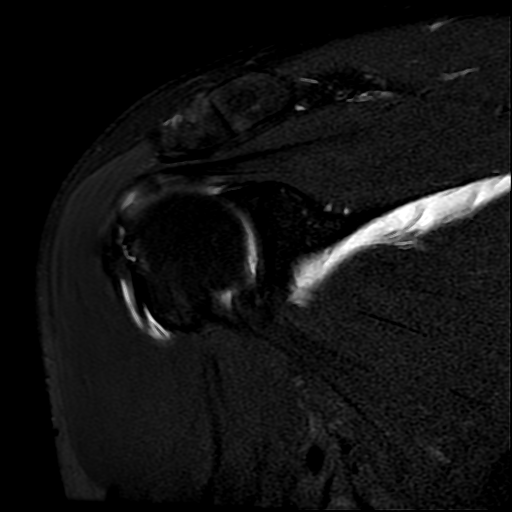
[im 10/20]
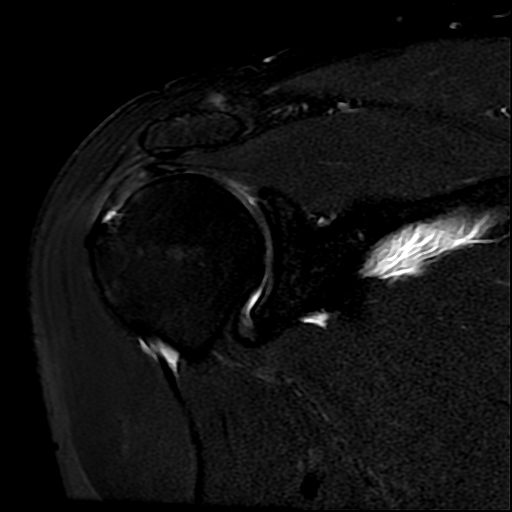
[im 13/20]
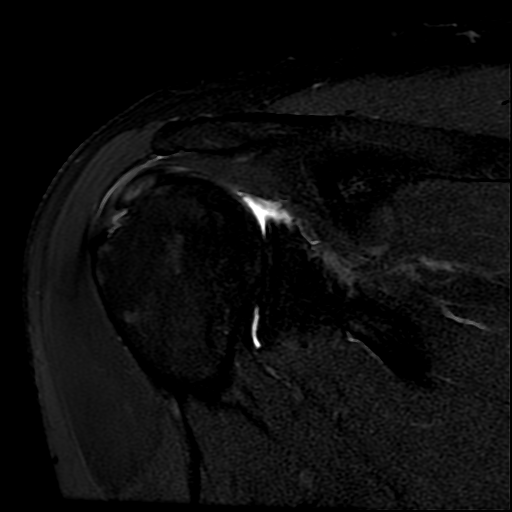
[im 16/20]
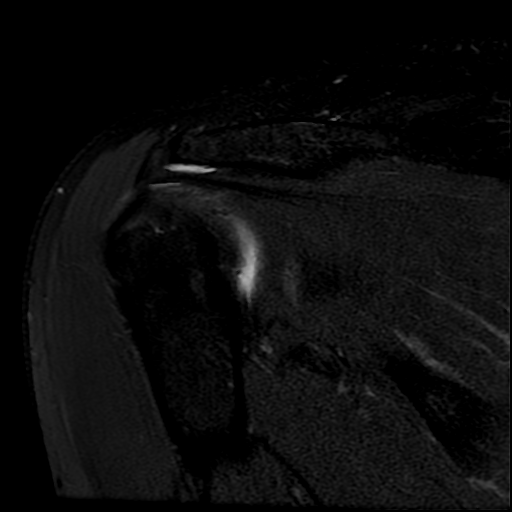
[im 20/20]
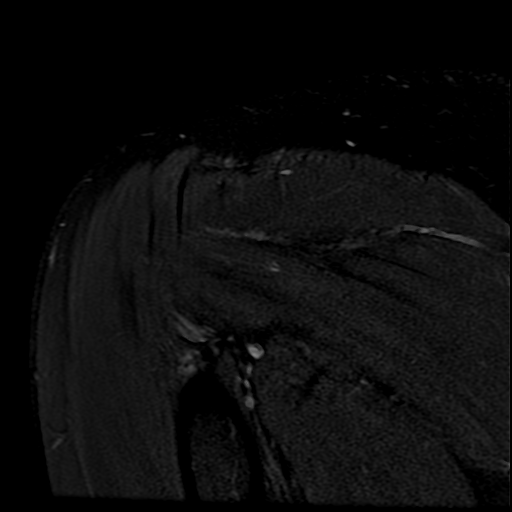

[Series 8: T2 fat-sat · coronal · 3.0mm · 0.27mm/px · 6 of 26 slices shown (2 of 2)]
[im 1/26]
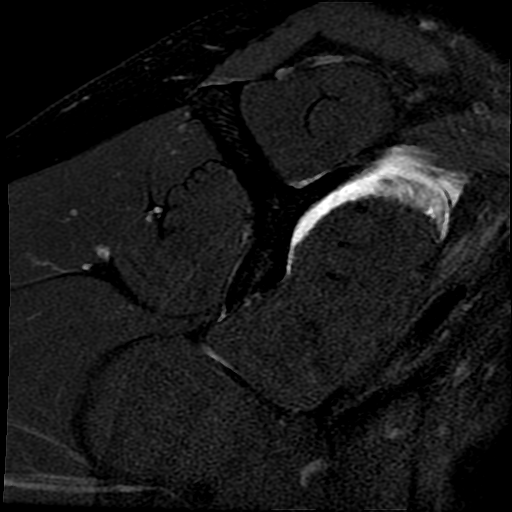
[im 4/26]
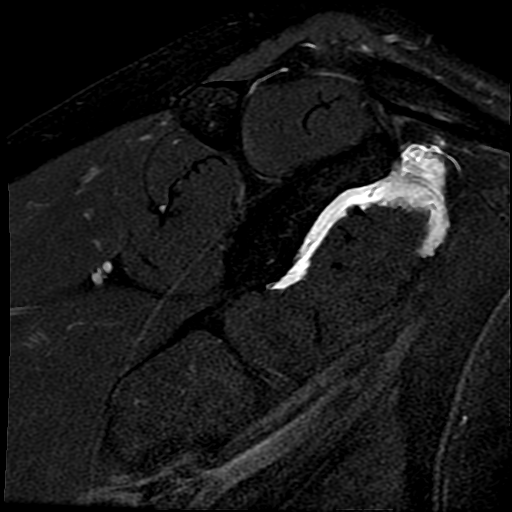
[im 7/26]
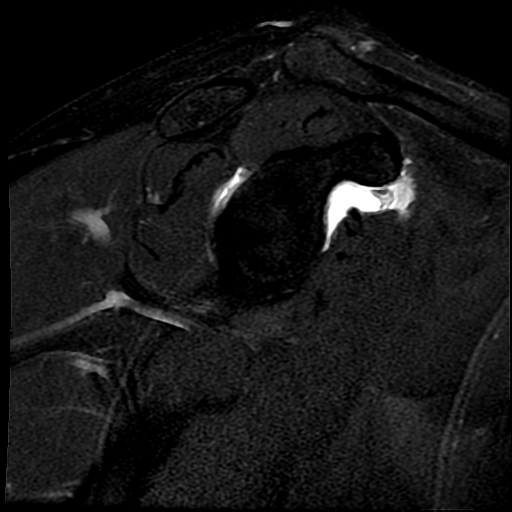
[im 10/26]
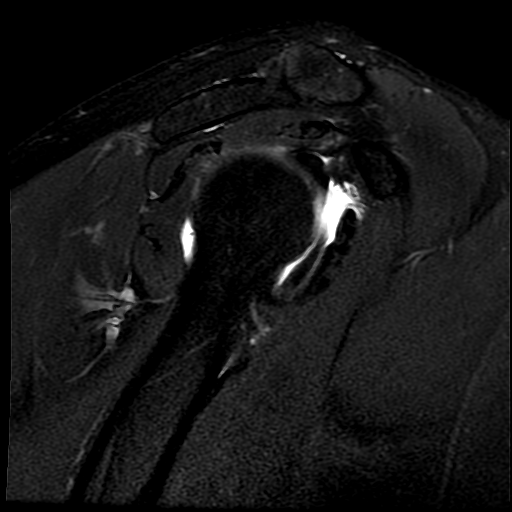
[im 13/26]
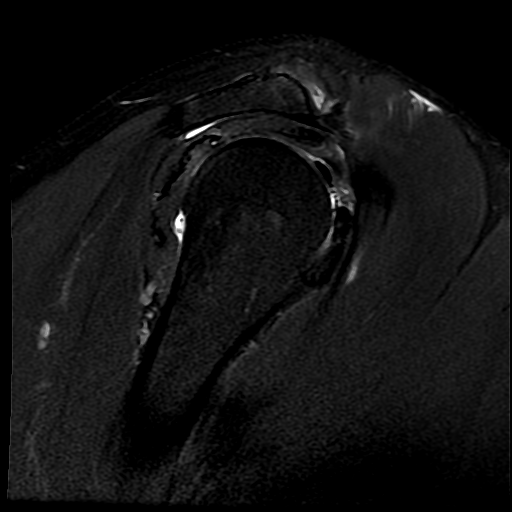
[im 22/26]
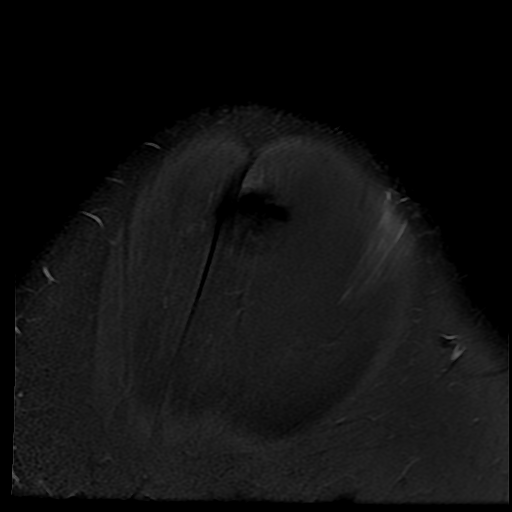

[19 of 40 positions shown; findings below may reference images not displayed]

FINDINGS: Rotator cuff: There is tendinosis and intermediate grade tearing of
the distal supra and infraspinatus tendons at and near the footprint
(coronal T1 image 10). Focal high-grade tear at the far anterior
fibers of the supraspinatus tendon (coronal T1 and T2 image 11 and
12). Teres minor tendon is intact. Subscapularis tendon is intact.

Muscles: No muscle atrophy or edema. No intramuscular fluid
collection or hematoma.

Biceps Long Head: No significant muscle atrophy.

Acromioclavicular Joint: Minimal arthropathy of the
acromioclavicular joint. No 16 subacromial/subdeltoid bursal fluid.

Glenohumeral Joint: Adequatedistension of the glenohumeral joint.
Mild chondrosis.

Labrum: There is posterior labral blunting and free edge fraying
with poorly visualized posterosuperior labrum, likely representing
degenerative tearing.

Bones: No fracture or dislocation. No aggressive osseous lesion.

Other: No fluid collection or hematoma.
IMPRESSION: Tendinosis and intermediate grade tearing of the distal
supraspinatus infraspinatus tendons at and near the footprint. Focal
high-grade articular sided tear of the far anterior fibers of the
supraspinatus tendon at the footprint. No significant muscle
atrophy.

Posterior labral blunting and free edge fraying with probable
degenerative tearing of the posterosuperior labrum.

## 2021-06-26 MED ORDER — LIDOCAINE HCL (PF) 1 % IJ SOLN
5.0000 mL | Freq: Once | INTRAMUSCULAR | Status: AC
  Administered 2021-06-26: 5 mL via INTRADERMAL

## 2021-06-26 MED ORDER — IOHEXOL 180 MG/ML  SOLN
10.0000 mL | Freq: Once | INTRAMUSCULAR | Status: AC | PRN
  Administered 2021-06-26: 10 mL via INTRA_ARTICULAR

## 2021-06-26 MED ORDER — SODIUM CHLORIDE (PF) 0.9 % IJ SOLN
10.0000 mL | Freq: Once | INTRAMUSCULAR | Status: AC
  Administered 2021-06-26: 10 mL

## 2021-06-26 MED ORDER — GADOBUTROL 1 MMOL/ML IV SOLN
0.0500 mL | Freq: Once | INTRAVENOUS | Status: AC | PRN
  Administered 2021-06-26: 0.05 mL

## 2021-07-08 ENCOUNTER — Telehealth: Payer: Self-pay | Admitting: *Deleted

## 2021-07-08 NOTE — Telephone Encounter (Signed)
   Pre-operative Risk Assessment    Patient Name: Matthew Stanton  DOB: 23-Dec-1970 MRN: [id number]      Request for Surgical Clearance   Procedure:   RIGHT SHOULDER SCOPE, ROTATOR CUFF REPAIR  Date of Surgery: Clearance 08/09/21                                 Surgeon:  DR. Elsie Saas Surgeon's Group or Practice Name:  [facility] Phone number:  [phone] Fax number:  [phone] ATTN: Venida Jarvis EXT [id number]   Type of Clearance Requested: - Medical  - Pharmacy:  Hold Aspirin     Type of Anesthesia:   General  and SCALENE BLOCK   Additional requests/questions:   Jiles Prows   07/08/2021, 1:46 PM

## 2021-07-09 NOTE — Telephone Encounter (Signed)
Yes this is fine.  He can hold aspirin 7 days before surgery and resume when it is safe after surgery.  Thanks

## 2021-07-09 NOTE — Telephone Encounter (Signed)
Dr. Burt Knack to review, patient underwent PFO closure on 09/21/2019.  He has upcoming rotator cuff repair, are you okay with the patient to come off of aspirin for 5 to 7 days prior to the procedure and restart as soon as possible afterward?  Talking with the patient, he has great exercise ability.  He runs 3.5 miles per day without any exertional chest pain or worsening dyspnea.  From the cardiac perspective, he should be cleared for the shoulder repair.  Dr. Burt Knack, please forward your response to P CV DIV PREOP

## 2021-07-10 NOTE — Telephone Encounter (Signed)
    Patient Name: Matthew Stanton  DOB: 1971-04-28 MRN: [id number]  Primary Cardiologist: Candee Furbish, MD  Chart reviewed as part of pre-operative protocol coverage. My colleague Almyra Deforest, PA-C, called and spoke with patient 07/09/2021 at which time he was doing very well from a cardiac standpoint. Given past medical history and time since last visit, based on ACC/AHA guidelines, Matthew Stanton would be at acceptable risk for the planned procedure without further cardiovascular testing.   Per Dr. Burt Knack, patient can hold Aspirin for 7 days prior to surgery. This should be resumed as soon as safely possible following surgery.   I will route this recommendation to the requesting party via Epic fax function and remove from pre-op pool.  Please call with questions.  Darreld Mclean, PA-C 07/10/2021, 10:07 AM

## 2021-08-16 ENCOUNTER — Other Ambulatory Visit: Payer: Self-pay

## 2021-08-16 ENCOUNTER — Ambulatory Visit (HOSPITAL_COMMUNITY): Payer: 59 | Attending: Orthopedic Surgery

## 2021-08-16 ENCOUNTER — Encounter (HOSPITAL_COMMUNITY): Payer: Self-pay

## 2021-08-16 DIAGNOSIS — R29898 Other symptoms and signs involving the musculoskeletal system: Secondary | ICD-10-CM | POA: Diagnosis present

## 2021-08-16 DIAGNOSIS — M25611 Stiffness of right shoulder, not elsewhere classified: Secondary | ICD-10-CM | POA: Insufficient documentation

## 2021-08-16 DIAGNOSIS — M25511 Pain in right shoulder: Secondary | ICD-10-CM | POA: Insufficient documentation

## 2021-08-16 NOTE — Patient Instructions (Signed)
Do not actively use your elbow (bending and straightening). Do not actively move your shoulder as well.  Use ice pack only to decrease the swelling in your shoulder. Apply for 10-12 minutes at a time.   1) SHOULDER: Flexion On Table   Place hands on towel placed on table, elbows straight. Lean forward with you upper body, pushing towel away from body.  _10__ reps per set.  2) Abduction (Passive)   With arm out to side, resting on towel placed on table with palm DOWN, keeping trunk away from table, lean to the side while pushing towel away from body.  Repeat __10__ times.   Copyright  VHI. All rights reserved.     3) Internal Rotation (Assistive)   Seated with elbow bent at right angle and held against side, slide arm on table surface in an inward arc keeping elbow anchored in place. Repeat __10__ times.  Activity: Use this motion to brush crumbs off the table. \  Copyright  VHI. All rights reserved.  AROM: Wrist Extension   With right palm down, bend wrist up. Repeat 10____ times per set.  Do __3__ sessions per day.  Copyright  VHI. All rights reserved.   AROM: Wrist Flexion   With right palm up, bend wrist up. Repeat ___10_ times per set.   AROM: Forearm Pronation / Supination   With right arm in handshake position, slowly rotate palm down until stretch is felt. Relax. Then rotate palm up until stretch is felt. Repeat __10__ times per set.  Do __3__ sessions per day.
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
## 2021-08-20 ENCOUNTER — Encounter (HOSPITAL_COMMUNITY): Payer: 59 | Admitting: Occupational Therapy

## 2021-08-21 ENCOUNTER — Ambulatory Visit (HOSPITAL_COMMUNITY): Payer: 59 | Admitting: Occupational Therapy

## 2021-08-22 ENCOUNTER — Ambulatory Visit (HOSPITAL_COMMUNITY): Payer: 59 | Admitting: Occupational Therapy

## 2021-08-22 ENCOUNTER — Encounter (HOSPITAL_COMMUNITY): Payer: Self-pay | Admitting: Occupational Therapy

## 2021-08-22 ENCOUNTER — Other Ambulatory Visit: Payer: Self-pay

## 2021-08-22 DIAGNOSIS — R29898 Other symptoms and signs involving the musculoskeletal system: Secondary | ICD-10-CM | POA: Diagnosis not present

## 2021-08-22 DIAGNOSIS — M25611 Stiffness of right shoulder, not elsewhere classified: Secondary | ICD-10-CM

## 2021-08-22 DIAGNOSIS — M25511 Pain in right shoulder: Secondary | ICD-10-CM
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
## 2021-08-29 ENCOUNTER — Ambulatory Visit (HOSPITAL_COMMUNITY): Payer: 59 | Admitting: Occupational Therapy

## 2021-08-29 ENCOUNTER — Other Ambulatory Visit: Payer: Self-pay

## 2021-08-29 ENCOUNTER — Encounter (HOSPITAL_COMMUNITY): Payer: Self-pay | Admitting: Occupational Therapy

## 2021-08-29 DIAGNOSIS — R29898 Other symptoms and signs involving the musculoskeletal system: Secondary | ICD-10-CM

## 2021-08-29 DIAGNOSIS — M25511 Pain in right shoulder: Secondary | ICD-10-CM

## 2021-08-29 DIAGNOSIS — M25611 Stiffness of right shoulder, not elsewhere classified: Secondary | ICD-10-CM
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
## 2021-09-03 ENCOUNTER — Ambulatory Visit (HOSPITAL_COMMUNITY): Payer: 59 | Attending: Orthopedic Surgery | Admitting: Occupational Therapy

## 2021-09-03 ENCOUNTER — Other Ambulatory Visit: Payer: Self-pay

## 2021-09-03 ENCOUNTER — Encounter (HOSPITAL_COMMUNITY): Payer: Self-pay | Admitting: Occupational Therapy

## 2021-09-03 DIAGNOSIS — M25611 Stiffness of right shoulder, not elsewhere classified: Secondary | ICD-10-CM

## 2021-09-03 DIAGNOSIS — M25511 Pain in right shoulder: Secondary | ICD-10-CM | POA: Diagnosis present

## 2021-09-03 DIAGNOSIS — R29898 Other symptoms and signs involving the musculoskeletal system: Secondary | ICD-10-CM
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
## 2021-09-05 ENCOUNTER — Ambulatory Visit (HOSPITAL_COMMUNITY): Payer: 59 | Admitting: Occupational Therapy

## 2021-09-05 ENCOUNTER — Encounter (HOSPITAL_COMMUNITY): Payer: Self-pay | Admitting: Occupational Therapy

## 2021-09-05 ENCOUNTER — Other Ambulatory Visit: Payer: Self-pay

## 2021-09-05 DIAGNOSIS — M25611 Stiffness of right shoulder, not elsewhere classified: Secondary | ICD-10-CM

## 2021-09-05 DIAGNOSIS — R29898 Other symptoms and signs involving the musculoskeletal system: Secondary | ICD-10-CM

## 2021-09-05 DIAGNOSIS — M25511 Pain in right shoulder: Secondary | ICD-10-CM
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
## 2021-09-10 ENCOUNTER — Encounter (HOSPITAL_COMMUNITY): Payer: Self-pay | Admitting: Occupational Therapy

## 2021-09-10 ENCOUNTER — Ambulatory Visit (HOSPITAL_COMMUNITY): Payer: 59 | Admitting: Occupational Therapy

## 2021-09-10 ENCOUNTER — Other Ambulatory Visit: Payer: Self-pay

## 2021-09-10 DIAGNOSIS — R29898 Other symptoms and signs involving the musculoskeletal system: Secondary | ICD-10-CM

## 2021-09-10 DIAGNOSIS — M25611 Stiffness of right shoulder, not elsewhere classified: Secondary | ICD-10-CM

## 2021-09-10 DIAGNOSIS — M25511 Pain in right shoulder: Secondary | ICD-10-CM

## 2021-09-10 NOTE — Patient Instructions (Signed)
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
## 2021-09-12 ENCOUNTER — Ambulatory Visit (HOSPITAL_COMMUNITY): Payer: 59

## 2021-09-12 ENCOUNTER — Other Ambulatory Visit: Payer: Self-pay

## 2021-09-12 DIAGNOSIS — M25511 Pain in right shoulder: Secondary | ICD-10-CM

## 2021-09-12 DIAGNOSIS — R29898 Other symptoms and signs involving the musculoskeletal system: Secondary | ICD-10-CM | POA: Diagnosis not present

## 2021-09-12 DIAGNOSIS — M25611 Stiffness of right shoulder, not elsewhere classified: Secondary | ICD-10-CM
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
## 2021-09-17 ENCOUNTER — Ambulatory Visit (HOSPITAL_COMMUNITY): Payer: 59 | Admitting: Occupational Therapy

## 2021-09-17 ENCOUNTER — Other Ambulatory Visit: Payer: Self-pay

## 2021-09-17 DIAGNOSIS — M25611 Stiffness of right shoulder, not elsewhere classified: Secondary | ICD-10-CM

## 2021-09-17 DIAGNOSIS — M25511 Pain in right shoulder: Secondary | ICD-10-CM

## 2021-09-17 DIAGNOSIS — R29898 Other symptoms and signs involving the musculoskeletal system: Secondary | ICD-10-CM | POA: Diagnosis not present
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
## 2021-09-17 NOTE — Patient Instructions (Signed)

## 2021-09-19 ENCOUNTER — Encounter (HOSPITAL_COMMUNITY): Payer: 59 | Admitting: Occupational Therapy

## 2021-09-24 ENCOUNTER — Encounter (HOSPITAL_COMMUNITY): Payer: Self-pay | Admitting: Occupational Therapy

## 2021-09-24 ENCOUNTER — Other Ambulatory Visit: Payer: Self-pay

## 2021-09-24 ENCOUNTER — Ambulatory Visit (HOSPITAL_COMMUNITY): Payer: 59 | Admitting: Occupational Therapy

## 2021-09-24 DIAGNOSIS — M25611 Stiffness of right shoulder, not elsewhere classified: Secondary | ICD-10-CM

## 2021-09-24 DIAGNOSIS — R29898 Other symptoms and signs involving the musculoskeletal system: Secondary | ICD-10-CM | POA: Diagnosis not present

## 2021-09-24 DIAGNOSIS — M25511 Pain in right shoulder: Secondary | ICD-10-CM

## 2021-09-24 NOTE — Patient Instructions (Signed)
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
## 2021-09-26 ENCOUNTER — Other Ambulatory Visit: Payer: Self-pay

## 2021-09-26 ENCOUNTER — Encounter (HOSPITAL_COMMUNITY): Payer: Self-pay | Admitting: Occupational Therapy

## 2021-09-26 ENCOUNTER — Ambulatory Visit (HOSPITAL_COMMUNITY): Payer: 59 | Admitting: Occupational Therapy

## 2021-09-26 DIAGNOSIS — R29898 Other symptoms and signs involving the musculoskeletal system: Secondary | ICD-10-CM

## 2021-09-26 DIAGNOSIS — M25611 Stiffness of right shoulder, not elsewhere classified: Secondary | ICD-10-CM

## 2021-09-26 DIAGNOSIS — M25511 Pain in right shoulder: Secondary | ICD-10-CM
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
## 2021-10-01 ENCOUNTER — Encounter (HOSPITAL_COMMUNITY): Payer: Self-pay | Admitting: Occupational Therapy

## 2021-10-01 ENCOUNTER — Other Ambulatory Visit: Payer: Self-pay

## 2021-10-01 ENCOUNTER — Ambulatory Visit (HOSPITAL_COMMUNITY): Payer: 59 | Admitting: Occupational Therapy

## 2021-10-01 DIAGNOSIS — M25511 Pain in right shoulder: Secondary | ICD-10-CM

## 2021-10-01 DIAGNOSIS — R29898 Other symptoms and signs involving the musculoskeletal system: Secondary | ICD-10-CM

## 2021-10-01 DIAGNOSIS — M25611 Stiffness of right shoulder, not elsewhere classified: Secondary | ICD-10-CM

## 2021-10-01 NOTE — Patient Instructions (Signed)
°  1) Flexion Wall Stretch    Face wall, place affected handon wall in front of you. Slide hand up the wall  and lean body in towards the wall. Hold for 10 seconds. Repeat 3-5 times. 1-2 times/day.     2) Towel Stretch with Internal Rotation       Gently pull up (or to the side) your affected arm  behind your back with the assist of a towel. Hold 10 seconds, repeat 3-5 times. 1-2 times/day.    3) Posterior Capsule Stretch    Bring the involved arm across chest. Grasp elbow and pull toward chest until you feel a stretch in the back of the upper arm and shoulder. Hold 10 seconds. Repeat 3-5X. Complete 1-2 times/day.    4) External Rotation Stretch:     Standing in an open doorway, place your arm on the edge of the doorway with the shoulder at 90 degrees from the side and elbow bent to 90 degrees. Lean forward until you feel a stretch on the front of your shoulder. Keep neck relaxed. Hold 10 seconds, repeat 3-5X. Complete 1-2 times/day
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
## 2021-10-03 ENCOUNTER — Other Ambulatory Visit: Payer: Self-pay

## 2021-10-03 ENCOUNTER — Encounter (HOSPITAL_COMMUNITY): Payer: Self-pay | Admitting: Occupational Therapy

## 2021-10-03 ENCOUNTER — Ambulatory Visit (HOSPITAL_COMMUNITY): Payer: 59 | Attending: Orthopedic Surgery | Admitting: Occupational Therapy

## 2021-10-03 DIAGNOSIS — R29898 Other symptoms and signs involving the musculoskeletal system: Secondary | ICD-10-CM | POA: Insufficient documentation

## 2021-10-03 DIAGNOSIS — M25611 Stiffness of right shoulder, not elsewhere classified: Secondary | ICD-10-CM | POA: Diagnosis present

## 2021-10-03 DIAGNOSIS — M25511 Pain in right shoulder: Secondary | ICD-10-CM | POA: Diagnosis present
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
## 2021-10-03 NOTE — Patient Instructions (Signed)
Therapy Ball Strengthening Exercises: Complete all exercises 10-15X each, 2x/day.   1) Overhead press: Hold a medicine ball or other ball at your chest. Next, extend your elbows and push the ball over your head as shown. Return to starting position and repeat.     2) Chest press: Hold a medicine ball or other ball at your chest. Next, extend your elbows and push the ball outward in front of your body as shown. Return to starting position and repeat.     3) Ball circles:  Hold a medicine ball or other ball at your chest. Next, extend your elbows and push the ball outward in front of your body as shown. Then, move the ball in a circular pattern for several revolutions and then reverse the direction.     4) Flexion: Start holding an exercise ball out in front of your body with elbows extended. Then, slowly lift the ball up over head. Return the ball to starting position and repeat.     5) Ball diagonals: Begin holding a ball with both hands and then move it through a diagonal pattern from your hip to over your opposite shoulder as shown.    

## 2021-10-08 ENCOUNTER — Encounter (HOSPITAL_COMMUNITY): Payer: 59 | Admitting: Occupational Therapy

## 2021-10-10 ENCOUNTER — Encounter (HOSPITAL_COMMUNITY): Payer: 59 | Admitting: Occupational Therapy

## 2021-10-15 ENCOUNTER — Encounter (HOSPITAL_COMMUNITY): Payer: 59 | Admitting: Occupational Therapy

## 2021-10-17 ENCOUNTER — Encounter (HOSPITAL_COMMUNITY): Payer: 59

## 2021-10-22 ENCOUNTER — Encounter (HOSPITAL_COMMUNITY): Payer: 59 | Admitting: Occupational Therapy

## 2021-10-24 ENCOUNTER — Encounter (HOSPITAL_COMMUNITY): Payer: 59

## 2021-10-29 ENCOUNTER — Encounter (HOSPITAL_COMMUNITY): Payer: 59 | Admitting: Occupational Therapy

## 2021-10-31 ENCOUNTER — Encounter (HOSPITAL_COMMUNITY): Payer: 59

## 2021-11-05 ENCOUNTER — Encounter (HOSPITAL_COMMUNITY): Payer: 59 | Admitting: Occupational Therapy

## 2021-11-07 ENCOUNTER — Encounter (HOSPITAL_COMMUNITY): Payer: 59 | Admitting: Occupational Therapy

## 2021-12-03 ENCOUNTER — Encounter: Payer: Self-pay | Admitting: Dermatology

## 2021-12-03 ENCOUNTER — Ambulatory Visit: Payer: 59 | Admitting: Dermatology

## 2021-12-03 DIAGNOSIS — L821 Other seborrheic keratosis: Secondary | ICD-10-CM | POA: Diagnosis not present

## 2021-12-03 DIAGNOSIS — Z1283 Encounter for screening for malignant neoplasm of skin: Secondary | ICD-10-CM | POA: Diagnosis not present

## 2021-12-03 DIAGNOSIS — D229 Melanocytic nevi, unspecified: Secondary | ICD-10-CM

## 2021-12-22 ENCOUNTER — Encounter: Payer: Self-pay | Admitting: Dermatology

## 2021-12-22 NOTE — Progress Notes (Signed)
? ?  Follow-Up Visit ?  ?Subjective  ?Matthew Stanton is a 51 y.o. male who presents for the following: Annual Exam (No new concerns). ? ?Annual skin examination ?Location:  ?Duration:  ?Quality:  ?Associated Signs/Symptoms: ?Modifying Factors:  ?Severity:  ?Timing: ?Context:  ? ?Objective  ?Well appearing patient in no apparent distress; mood and affect are within normal limits. ?Full body exam: No atypical pigmented lesions (all checked with dermoscopy) or nonmelanoma skin cancer.  Flattopped textured papules typical seborrheic keratoses on arms and back. ? ? ? ?A full examination was performed including scalp, head, eyes, ears, nose, lips, neck, chest, axillae, abdomen, back, buttocks, bilateral upper extremities, bilateral lower extremities, hands, feet, fingers, toes, fingernails, and toenails. All findings within normal limits unless otherwise noted below. ? ? ?Assessment & Plan  ? ? ?Encounter for screening for malignant neoplasm of skin ? ?Annual skin examination, encouraged to self examine twice annually.  Continued ultraviolet protection. ? ? ? ? ? ?I, Lavonna Monarch, MD, have reviewed all documentation for this visit.  The documentation on 12/22/21 for the exam, diagnosis, procedures, and orders are all accurate and complete. ?

## 2022-05-14 NOTE — Progress Notes (Signed)
HPI male former smoker followed for OSA, complicated by emolic CVA, PFO closure, Covid infection Nov, 2020,  NPSG 06/15/98    AHI 88/ hr, body weight 250 lbs  --------------------------------------------------------------------------  05/18/21- 51 year-old male former smoker followed for OSA, complicated by embolic CVA, PFO closure, Covid infection Nov, 2020,  CPAP 10/ [facility] Download- didn't bring card Body weight today-193 lbs Covid vax-3 Phizer  Download reviewed. Reports doing well with CPAP- no concerns. Gets flu vax at PCP. Denies interval medical concerns.   05/15/22- 32 year-old male former smoker followed for OSA, complicated by embolic CVA, PFO closure, Covid infection Nov, 2020,  CPAP 5-15/ [facility]     AirSense11 AutoSet Download compliance  83%, AHI 0.5/ hr Body weight today-202 lbs Covid vax-3 Phizer  Flu Vax-today standard -----Pt f/u for OSA, machine working well. Getting 7hrs/night of sleep Considers CPAP " best thing we ever did". No problems Asks flu vax Breathing fine. No problem with shoulder surgery earlier this year.  ROS-see HPI + = positive Constitutional:   No-   weight loss, night sweats, fevers, chills, fatigue, lassitude. HEENT:   No-  headaches, difficulty swallowing, tooth/dental problems, sore throat,       No-  sneezing, itching, ear ache, nasal congestion, post nasal drip,  CV:  No-   chest pain, orthopnea, PND, swelling in lower extremities, anasarca,                       dizziness, palpitations Resp: No-   shortness of breath with exertion or at rest.              No-   productive cough,  No non-productive cough,  No- coughing up of blood.              No-   change in color of mucus.  No- wheezing.   Skin: No-   rash or lesions. GI:  No-   heartburn, indigestion, abdominal pain, nausea, vomiting,  GU: . MS:  No-   joint pain or swelling.   Neuro-     nothing unusual Psych:  No- change in mood or affect. No depression or anxiety.  No memory  loss.  OBJ- Physical Exam General- Alert, Oriented, Affect-appropriate, Distress- none acute,  Skin- rash-none, lesions- none, excoriation- none Lymphadenopathy- none Head- atraumatic            Eyes- Gross vision intact, PERRLA, conjunctivae and secretions clear            Ears- Hearing, canals-normal            Nose- Clear, no-Septal dev, mucus, polyps, erosion, perforation             Throat- Mallampati III , mucosa clear , drainage- none, tonsils- atrophic Neck- flexible , trachea midline, no stridor , thyroid nl, carotid no bruit Chest - symmetrical excursion , unlabored           Heart/CV- RRR , no murmur , no gallop  , no rub, nl s1 s2                           - JVD- none , edema- none, stasis changes- none, varices- none           Lung- clear to P&A, wheeze- none, cough- none , dullness-none, rub- none           Chest wall-  Abd-  Br/ Gen/ Rectal- Not done,  not indicated Extrem- cyanosis- none, clubbing, none, atrophy- none, strength- nl Neuro- grossly intact to observation

## 2022-05-15 ENCOUNTER — Encounter: Payer: Self-pay | Admitting: Internal Medicine

## 2022-05-15 ENCOUNTER — Ambulatory Visit: Payer: 59 | Admitting: Internal Medicine

## 2022-05-15 VITALS — BP 118/82 | HR 85 | Ht 66.0 in | Wt 202.8 lb

## 2022-05-15 DIAGNOSIS — Z23 Encounter for immunization: Secondary | ICD-10-CM | POA: Diagnosis not present

## 2022-05-15 DIAGNOSIS — G4733 Obstructive sleep apnea (adult) (pediatric): Secondary | ICD-10-CM

## 2022-05-15 DIAGNOSIS — U071 COVID-19: Secondary | ICD-10-CM | POA: Diagnosis not present

## 2022-05-15 NOTE — Patient Instructions (Signed)
Order- Flu vax  standard  We can continue CPAP auto 5-15  Please call if we can help

## 2022-05-15 NOTE — Assessment & Plan Note (Signed)
Benefits from CPAP, good compliance and control Plan- continue auto 5-15

## 2022-05-15 NOTE — Assessment & Plan Note (Signed)
Discussed advice re updating Covid vaccine this winter

## 2022-12-09 ENCOUNTER — Ambulatory Visit: Payer: 59 | Admitting: Dermatology

## 2023-05-16 NOTE — Progress Notes (Signed)
HPI male former smoker followed for OSA, complicated by emolic CVA, PFO closure, Covid infection Nov, 2020,  NPSG 06/15/98    AHI 88/ hr, body weight 250 lbs  --------------------------------------------------------------------------   05/15/22- 52 year-old male former smoker followed for OSA, complicated by embolic CVA, PFO closure, Covid infection Nov, 2020,  CPAP 5-15/ [facility]     AirSense11 AutoSet Download compliance  83%, AHI 0.5/ hr Body weight today-202 lbs Covid vax-3 Phizer  Flu Vax-today standard -----Pt f/u for OSA, machine working well. Getting 7hrs/night of sleep Considers CPAP " best thing we ever did". No problems Asks flu vax Breathing fine. No problem with shoulder surgery earlier this year.  05/18/23- 52 year-old male former smoker followed for OSA, complicated by embolic CVA, PFO closure, Covid infection Nov, 2020,  CPAP 5-15/ [facility]     AirSense11 AutoSet Download compliance  87%, AHI 0.4/hr Body weight today-187 lbs Asking to change DME to [place]- closer to his home. Download reviewed.  Old machine but doing very well.  He would like to change to a DME company closer to home as discussed.  Breathing is comfortable. Plan-flu vaccine today.  ROS-see HPI + = positive Constitutional:   No-   weight loss, night sweats, fevers, chills, fatigue, lassitude. HEENT:   No-  headaches, difficulty swallowing, tooth/dental problems, sore throat,       No-  sneezing, itching, ear ache, nasal congestion, post nasal drip,  CV:  No-   chest pain, orthopnea, PND, swelling in lower extremities, anasarca,                       dizziness, palpitations Resp: No-   shortness of breath with exertion or at rest.              No-   productive cough,  No non-productive cough,  No- coughing up of blood.              No-   change in color of mucus.  No- wheezing.   Skin: No-   rash or lesions. GI:  No-   heartburn, indigestion, abdominal pain, nausea, vomiting,  GU: . MS:  No-    joint pain or swelling.   Neuro-     nothing unusual Psych:  No- change in mood or affect. No depression or anxiety.  No memory loss.  OBJ- Physical Exam General- Alert, Oriented, Affect-appropriate, Distress- none acute,  Skin- rash-none, lesions- none, excoriation- none Lymphadenopathy- none Head- atraumatic            Eyes- Gross vision intact, PERRLA, conjunctivae and secretions clear            Ears- Hearing, canals-normal            Nose- Clear, no-Septal dev, mucus, polyps, erosion, perforation             Throat- Mallampati III , mucosa clear , drainage- none, tonsils- atrophic Neck- flexible , trachea midline, no stridor , thyroid nl, carotid no bruit Chest - symmetrical excursion , unlabored           Heart/CV- RRR , no murmur , no gallop  , no rub, nl s1 s2                           - JVD- none , edema- none, stasis changes- none, varices- none           Lung- clear to P&A,  wheeze- none, cough- none , dullness-none, rub- none           Chest wall-  Abd-  Br/ Gen/ Rectal- Not done, not indicated Extrem- cyanosis- none, clubbing, none, atrophy- none, strength- nl Neuro- grossly intact to observation

## 2023-05-18 ENCOUNTER — Ambulatory Visit: Payer: 59 | Admitting: Internal Medicine

## 2023-05-18 ENCOUNTER — Encounter: Payer: Self-pay | Admitting: Internal Medicine

## 2023-05-18 VITALS — BP 124/82 | HR 76 | Temp 97.3°F | Ht 66.0 in | Wt 187.0 lb

## 2023-05-18 DIAGNOSIS — G4733 Obstructive sleep apnea (adult) (pediatric): Secondary | ICD-10-CM

## 2023-05-18 DIAGNOSIS — I63412 Cerebral infarction due to embolism of left middle cerebral artery: Secondary | ICD-10-CM

## 2023-05-18 DIAGNOSIS — Z23 Encounter for immunization: Secondary | ICD-10-CM | POA: Diagnosis not present

## 2023-05-18 NOTE — Patient Instructions (Signed)
Order- flu vax- standard  [facility] to [facility] (pt request- closer to his home) Replace old CPAP machine auto 5-15, mask of choice, humidifier, supplies, AirView/ card

## 2023-05-27 ENCOUNTER — Encounter: Payer: Self-pay | Admitting: Internal Medicine

## 2023-05-27 NOTE — Assessment & Plan Note (Signed)
No recurrence and no obvious residual neurologic deficit during seated office evaluation today.

## 2023-05-27 NOTE — Assessment & Plan Note (Signed)
Benefits from CPAP with good compliance and control Plan- continue auto 5-15

## 2024-02-26 ENCOUNTER — Encounter (HOSPITAL_COMMUNITY): Payer: Self-pay | Admitting: Interventional Radiology

## 2024-06-13 NOTE — Progress Notes (Unsigned)
 HPI male former smoker followed for OSA, complicated by emolic CVA, PFO closure, Covid infection Nov, 2020,  NPSG 06/15/98    AHI 88/ hr, body weight 250 lbs  --------------------------------------------------------------------------  05/18/23- 53 year-old male former smoker followed for OSA, complicated by embolic CVA, PFO closure, Covid infection Nov, 2020,  CPAP 5-15/ [facility]     AirSense11 AutoSet Download compliance  87%, AHI 0.4/hr Body weight today-187 lbs Asking to change DME to [place]- closer to his home. Download reviewed.  Old machine but doing very well.  He would like to change to a DME company closer to home as discussed.  Breathing is comfortable. Plan-flu vaccine today.  06/14/24- 53 year-old male former smoker followed for OSA, complicated by embolic CVA, PFO closure, Covid infection Nov, 2020,  CPAP 5-15/ [facility]> [facility]   AirSense11 AutoSet  changed DME and replaced machine 05/2023 Download compliance  - 100%, AHI 0.6/hr Body weight today-198 lbs       Sleep is good.  No problems noted. Discussed the use of AI scribe software for clinical note transcription with the patient, who gave verbal consent to proceed.  History of Present Illness   Matthew Stanton is a 53 year old male with obstructive sleep apnea who presents for follow-up regarding CPAP therapy.  He uses a CPAP machine nightly with a pressure range of 5 to 15 cm H2O, primarily at 8 to 9 cm H2O, experiencing less than one breakthrough apnea per hour. He asks again about obtaining a new CPAP machine from [place], as he did not receive a call from them about a year ago. He switched from [facility] to [place] due to his location in [place]. He has no other concerns or issues and maintains his usual activities.     Assessment and Plan:    Obstructive sleep apnea Obstructive sleep apnea well-managed with CPAP. Current settings effective. No new symptoms. - Order new  CPAP machine from [place]. - Advise visit to [facility] to confirm CPAP services. - Instruct to report issues with CPAP acquisition before provider's December departure.     Flu vax- standard  ROS-see HPI + = positive Constitutional:   No-   weight loss, night sweats, fevers, chills, fatigue, lassitude. HEENT:   No-  headaches, difficulty swallowing, tooth/dental problems, sore throat,       No-  sneezing, itching, ear ache, nasal congestion, post nasal drip,  CV:  No-   chest pain, orthopnea, PND, swelling in lower extremities, anasarca,                       dizziness, palpitations Resp: No-   shortness of breath with exertion or at rest.              No-   productive cough,  No non-productive cough,  No- coughing up of blood.              No-   change in color of mucus.  No- wheezing.   Skin: No-   rash or lesions. GI:  No-   heartburn, indigestion, abdominal pain, nausea, vomiting,  GU: . MS:  No-   joint pain or swelling.   Neuro-     nothing unusual Psych:  No- change in mood or affect. No depression or anxiety.  No memory loss.  OBJ- Physical Exam General- Alert, Oriented, Affect-appropriate, Distress- none acute,  Skin- rash-none, lesions- none, excoriation- none Lymphadenopathy- none Head- atraumatic  Eyes- Gross vision intact, PERRLA, conjunctivae and secretions clear            Ears- Hearing, canals-normal            Nose- Clear, no-Septal dev, mucus, polyps, erosion, perforation             Throat- Mallampati III , mucosa clear , drainage- none, tonsils- atrophic Neck- flexible , trachea midline, no stridor , thyroid nl, carotid no bruit Chest - symmetrical excursion , unlabored           Heart/CV- RRR , no murmur , no gallop  , no rub, nl s1 s2                           - JVD- none , edema- none, stasis changes- none, varices- none           Lung- clear to P&A, wheeze- none, cough- none , dullness-none, rub- none           Chest wall-   Abd-  Br/ Gen/ Rectal- Not done, not indicated Extrem- cyanosis- none, clubbing, none, atrophy- none, strength- nl Neuro- grossly intact to observation

## 2024-06-14 ENCOUNTER — Encounter: Payer: Self-pay | Admitting: Internal Medicine

## 2024-06-14 ENCOUNTER — Ambulatory Visit: Admitting: Internal Medicine

## 2024-06-14 VITALS — BP 128/86 | HR 88 | Temp 98.0°F | Ht 66.0 in | Wt 198.8 lb

## 2024-06-14 DIAGNOSIS — G4733 Obstructive sleep apnea (adult) (pediatric): Secondary | ICD-10-CM | POA: Diagnosis not present

## 2024-06-14 DIAGNOSIS — Z23 Encounter for immunization: Secondary | ICD-10-CM | POA: Diagnosis not present

## 2024-06-14 NOTE — Patient Instructions (Signed)
 Order- change DME to [facility], replace old CPAP machine auto 5-15, mask of choice, humidifier supplies, AirView/ card  Please call if we can help

## 2024-06-16 ENCOUNTER — Telehealth: Payer: Self-pay

## 2024-06-16 DIAGNOSIS — G4733 Obstructive sleep apnea (adult) (pediatric): Secondary | ICD-10-CM

## 2024-06-16 NOTE — Telephone Encounter (Signed)
 Copied from CRM [id number]. 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  Please remain with [facility] for all supplies.  Any questions please call patient at [phone]  This has been resent
